# Patient Record
Sex: Male | Born: 1960
Health system: Southern US, Community
[De-identification: ages and names within clinical notes are randomized; demographics above are authoritative.]

## PROBLEM LIST (undated history)

## (undated) DIAGNOSIS — F1910 Other psychoactive substance abuse, uncomplicated: Secondary | ICD-10-CM

## (undated) DIAGNOSIS — E119 Type 2 diabetes mellitus without complications: Secondary | ICD-10-CM

## (undated) DIAGNOSIS — A159 Respiratory tuberculosis unspecified: Secondary | ICD-10-CM

## (undated) HISTORY — DX: Type 2 diabetes mellitus without complications: E11.9

## (undated) HISTORY — DX: Respiratory tuberculosis unspecified: A15.9

## (undated) HISTORY — DX: Other psychoactive substance abuse, uncomplicated: F19.10

---

## 2000-05-23 ENCOUNTER — Emergency Department (HOSPITAL_COMMUNITY): Admission: EM | Admit: 2000-05-23 | Discharge: 2000-05-23 | Payer: Self-pay | Admitting: Emergency Medicine

## 2000-06-08 ENCOUNTER — Ambulatory Visit (HOSPITAL_BASED_OUTPATIENT_CLINIC_OR_DEPARTMENT_OTHER): Admission: RE | Admit: 2000-06-08 | Discharge: 2000-06-08 | Payer: Self-pay | Admitting: General Surgery

## 2015-06-29 DIAGNOSIS — Z139 Encounter for screening, unspecified: Secondary | ICD-10-CM

## 2015-06-29 NOTE — Congregational Nurse Program (Signed)
Congregational Nurse Program Note  Date of Encounter: 06/29/2015  Past Medical History: No past medical history on file.  Encounter Details:     CNP Questionnaire - 06/29/15 1632    Patient Demographics   Is this a new or existing patient? New   Patient is considered a/an Not Applicable   Patient Assistance   Patient's financial/insurance status Uninsured;Low Income   Patient referred to apply for the following financial assistance Not Applicable   Food insecurities addressed Provided food supplies   Transportation assistance No   Assistance securing medications No   Educational health offerings Not Applicable   Encounter Details   Primary purpose of visit Education/Health Concerns   Was an Emergency Department visit averted? Not Applicable   Does patient have a medical provider? Yes   Patient referred to Not Applicable   Was a mental health screening completed? (GAINS tool) No   Does patient have dental issues? No   Since previous encounter, have you referred patient for abnormal blood glucose that resulted in a new diagnosis or medication change? No   For Abstraction Use Only   Does patient have insurance? No    Clinic visit for B/P check.  [place]  Plans to establish with HCP once he has insurance again.  Is currently working.

## 2016-04-13 ENCOUNTER — Encounter: Payer: Self-pay | Admitting: Nurse Practitioner

## 2016-04-13 ENCOUNTER — Other Ambulatory Visit (INDEPENDENT_AMBULATORY_CARE_PROVIDER_SITE_OTHER): Payer: BLUE CROSS/BLUE SHIELD

## 2016-04-13 ENCOUNTER — Ambulatory Visit (INDEPENDENT_AMBULATORY_CARE_PROVIDER_SITE_OTHER): Payer: BLUE CROSS/BLUE SHIELD | Admitting: Nurse Practitioner

## 2016-04-13 DIAGNOSIS — Z23 Encounter for immunization: Secondary | ICD-10-CM

## 2016-04-13 DIAGNOSIS — R739 Hyperglycemia, unspecified: Secondary | ICD-10-CM | POA: Diagnosis not present

## 2016-04-13 DIAGNOSIS — R6889 Other general symptoms and signs: Secondary | ICD-10-CM | POA: Diagnosis not present

## 2016-04-13 DIAGNOSIS — Z8042 Family history of malignant neoplasm of prostate: Secondary | ICD-10-CM | POA: Diagnosis not present

## 2016-04-13 DIAGNOSIS — Z0000 Encounter for general adult medical examination without abnormal findings: Secondary | ICD-10-CM

## 2016-04-13 DIAGNOSIS — E782 Mixed hyperlipidemia: Secondary | ICD-10-CM | POA: Diagnosis not present

## 2016-04-13 DIAGNOSIS — Z0001 Encounter for general adult medical examination with abnormal findings: Secondary | ICD-10-CM

## 2016-04-13 DIAGNOSIS — E119 Type 2 diabetes mellitus without complications: Secondary | ICD-10-CM | POA: Insufficient documentation

## 2016-04-13 LAB — COMPREHENSIVE METABOLIC PANEL
ALBUMIN: 4.3 g/dL (ref 3.5–5.2)
ALT: 21 U/L (ref 0–53)
AST: 15 U/L (ref 0–37)
Alkaline Phosphatase: 74 U/L (ref 39–117)
BUN: 13 mg/dL (ref 6–23)
CALCIUM: 9.8 mg/dL (ref 8.4–10.5)
CHLORIDE: 104 meq/L (ref 96–112)
CO2: 31 mEq/L (ref 19–32)
Creatinine, Ser: 1.01 mg/dL (ref 0.40–1.50)
GFR: 98.76 mL/min (ref >60.00)
Glucose, Bld: 102 mg/dL — ABNORMAL HIGH (ref 70–99)
POTASSIUM: 5.2 meq/L — AB (ref 3.5–5.1)
SODIUM: 139 meq/L (ref 135–145)
Total Bilirubin: 0.4 mg/dL (ref 0.2–1.2)
Total Protein: 7.4 g/dL (ref 6.0–8.3)

## 2016-04-13 LAB — CBC WITH DIFFERENTIAL/PLATELET
BASOS PCT: 0.4 % (ref 0.0–3.0)
Basophils Absolute: 0 10*3/uL (ref 0.0–0.1)
EOS PCT: 1.1 % (ref 0.0–5.0)
Eosinophils Absolute: 0.1 10*3/uL (ref 0.0–0.7)
HCT: 43.6 % (ref 39.0–52.0)
HEMOGLOBIN: 14.7 g/dL (ref 13.0–17.0)
Lymphocytes Relative: 38 % (ref 12.0–46.0)
Lymphs Abs: 2.8 10*3/uL (ref 0.7–4.0)
MCHC: 33.7 g/dL (ref 30.0–36.0)
MCV: 86.6 fl (ref 78.0–100.0)
MONO ABS: 0.7 10*3/uL (ref 0.1–1.0)
Monocytes Relative: 9 % (ref 3.0–12.0)
NEUTROS ABS: 3.7 10*3/uL (ref 1.4–7.7)
Neutrophils Relative %: 51.5 % (ref 43.0–77.0)
PLATELETS: 309 10*3/uL (ref 150.0–400.0)
RBC: 5.04 Mil/uL (ref 4.22–5.81)
RDW: 12.9 % (ref 11.5–15.5)
WBC: 7.3 10*3/uL (ref 4.0–10.5)

## 2016-04-13 LAB — LIPID PANEL
CHOL/HDL RATIO: 4
Cholesterol: 215 mg/dL — ABNORMAL HIGH (ref 0–200)
HDL: 53 mg/dL (ref >39.00)
LDL CALC: 132 mg/dL — AB (ref 0–99)
NONHDL: 162.43
Triglycerides: 151 mg/dL — ABNORMAL HIGH (ref 0.0–149.0)
VLDL: 30.2 mg/dL (ref 0.0–40.0)

## 2016-04-13 LAB — PSA: PSA: 0.61 ng/mL (ref 0.10–4.00)

## 2016-04-13 LAB — HEMOGLOBIN A1C: Hgb A1c MFr Bld: 6.2 % (ref 4.6–6.5)

## 2016-04-13 LAB — TSH: TSH: 2.46 u[IU]/mL (ref 0.35–4.50)

## 2016-04-13 MED ORDER — PRAVASTATIN SODIUM 40 MG PO TABS: 40.0000 mg | ORAL_TABLET | Freq: Every day | ORAL | 3 refills | Status: DC

## 2016-04-13 NOTE — Progress Notes (Addendum)
Subjective:    Patient ID: Stephen Wong, male    DOB: 16-Jul-1961, 55 y.o.   MRN: [id number]  Patient presents today for complete physical or establish care (new patient)  HPI Denies any acute complaints today. I Immunizations: (TDAP, Hep C screen, Pneumovax, Influenza, zoster)  Health Maintenance  Topic Date Due  .  Hepatitis C: One time screening is recommended by Center for Disease Control  (CDC) for  adults born from [birth year]1945 through 1965.   017-Dec-1962  . HIV Screening  08/17/1976  . Flu Shot  03/31/2017*  . Colon Cancer Screening  07/01/2022  . Tetanus Vaccine  04/13/2026  *Topic was postponed. The date shown is not the original due date.   Diet:None Weight: Not concerned at this time Wt Readings from Last 3 Encounters:  06/29/15 168 lb (76.2 kg)    Exercise:Goes to gym 2-3 times a week. Depression/Suicide: Denies any No flowsheet data found. No flowsheet data found. Colonoscopy (every 5-859yrs, >50-6493yrs): 2 years ago, normal per patient, done by [facility]. PSA (yearly, >7535yrs): Never done [facility]: Up-to-date Dental: Patient was scheduled Sexual History (birth control, marital status, STD): Single and not sexually active.  Medications and allergies reviewed with patient and updated if appropriate.  Patient Active Problem List   Diagnosis Date Noted  . FHx: cancer of prostate 04/13/2016  . Hyperlipidemia, mixed 04/13/2016  . Hyperglycemia 04/13/2016    No current outpatient prescriptions on file prior to visit.   No current facility-administered medications on file prior to visit.     Past Medical History:  Diagnosis Date  . Substance abuse    Lead to incarceration, now 20 years sober from cocaine and alcohol.  . 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    History reviewed. No pertinent surgical history.  Social History   Social History  . Marital status: Single    Spouse name: N/A  . Number of children:  N/A  . Years of education: N/A   Social History Main Topics  . Smoking status: Former Games developermoker  . Smokeless tobacco: Never Used  . Alcohol use No     Comment: 20 years sober, does not attend AA meetings at this time.  . Drug use: No     Comment: Has not used any cocaine in the last 20 years.  . Sexual activity: No   Other Topics Concern  . None   Social History Narrative  . None    Family History  Problem Relation Age of Onset  . Cancer Mother   . Cancer Father   . Heart disease Sister         Review of Systems  Constitutional: Negative for fever, malaise/fatigue and weight loss.  HENT: Negative for congestion and sore throat.   Eyes:       Negative for visual changes  Respiratory: Negative for cough and shortness of breath.   Cardiovascular: Negative for chest pain, palpitations and leg swelling.  Gastrointestinal: Negative for blood in stool, constipation, diarrhea and heartburn.  Genitourinary: Negative for dysuria, frequency and urgency.  Musculoskeletal: Negative for falls, joint pain and myalgias.  Skin: Negative for rash.  Neurological: Negative for dizziness, sensory change and headaches.  Endo/Heme/Allergies: Does not bruise/bleed easily.  Psychiatric/Behavioral: Negative for depression, substance abuse and suicidal ideas. The patient is not nervous/anxious.     Objective:  There were no vitals filed for this visit.  There is no height or weight on file to calculate  BMI.   Physical Examination:  Physical Exam  Constitutional: He is oriented to person, place, and time and well-developed, well-nourished, and in no distress. No distress.  HENT:  Right Ear: External ear normal.  Left Ear: External ear normal.  Nose: Nose normal.  Mouth/Throat: Oropharynx is clear and moist. No oropharyngeal exudate.  Eyes: Conjunctivae and EOM are normal. Pupils are equal, round, and reactive to light. No scleral icterus.  Neck: Normal range of motion. Neck supple. No  thyromegaly present.  Cardiovascular: Normal rate, regular rhythm, normal heart sounds and intact distal pulses.   Pulmonary/Chest: Effort normal and breath sounds normal.  Abdominal: Bowel sounds are normal. He exhibits no distension. There is no tenderness.  Genitourinary:  Genitourinary Comments: Patient declined.  Musculoskeletal: Normal range of motion. He exhibits no edema or tenderness.  Lymphadenopathy:    He has no cervical adenopathy.  Neurological: He is alert and oriented to person, place, and time. He has normal reflexes. No cranial nerve deficit. Gait normal.  Skin: Skin is warm and dry.  Psychiatric: Affect and judgment normal.  Vitals reviewed.   ASSESSMENT and PLAN:  Ebb was seen today for establish care.  Diagnoses and all orders for this visit:  Encounter for preventative adult health care exam with abnormal findings -     EKG 12-Lead -     CBC w/Diff; Future -     Comprehensive metabolic panel; Future -     TSH; Future -     Hemoglobin A1c; Future -     Lipid Profile; Future -     PSA; Future -     HIV antibody (with reflex); Future -     Hepatitis C Ab Reflex HCV RNA, QUANT; Future  FHx: cancer of prostate  Need for prophylactic vaccination with combined diphtheria-tetanus-pertussis (DTP) vaccine -     Tdap vaccine greater than or equal to 7yo IM  Hyperlipidemia, mixed -     pravastatin (PRAVACHOL) 40 MG tablet; Take 1 tablet (40 mg total) by mouth daily.  Hyperglycemia  Normal EKG, no ST interval or T-wave abnormality.  Hyperlipidemia, mixed LDL goal less than 70 FH of CAD/MI, sudden death of sister at 68. Start pravastatin 40 mg.  Follow-up in 3 months  Hyperglycemia Repeat A1c in 3 months. Diet and exercise recommended for now.     Follow up: Return in about 3 months (around 07/13/2016) for Hyperlipidermia, hyperglycemia.  Alysia Penna, NP

## 2016-04-13 NOTE — Addendum Note (Signed)
Addended by: Michaela CornerNCHE, Zira Helinski L on: 04/13/2016 04:38 PM   Modules accepted: Orders

## 2016-04-13 NOTE — Progress Notes (Signed)
abnormal, Inform patient CBC, PSA, and TSH normal. Hgb A1C indicates prediabetes: this can improves with low sugar and low carb diet. Increase vegetables and fruit intake. CMP indicates mild increase in potassium and glucose. Will repeat levels in 3months. Lipid panel indicates increase in total cholesterol, triglycerides, and LDL. Due to patient's risk factors (first degree relative with an MI, prediabetes, and age); I recommend starting patient on low-dose statin, regular daily exercise, and heart healthy diet. Prescription for pravastatin was sent. Start today at bedtime. He needs to make an appointment to follow-up in 3 months.

## 2016-04-13 NOTE — Patient Instructions (Signed)
Maintain heart healthy diet and regular daily exercise. Go to lab downstairs for blood draw

## 2016-04-13 NOTE — Assessment & Plan Note (Signed)
LDL goal less than 70 FH of CAD/MI, sudden death of sister at 451. Start pravastatin 40 mg.  Follow-up in 3 months

## 2016-04-13 NOTE — Addendum Note (Signed)
Addended by: Radford PaxAIRRIKIER DAVIDSON, Rena Sweeden M on: 04/13/2016 02:09 PM   Modules accepted: Orders

## 2016-04-13 NOTE — Assessment & Plan Note (Signed)
Repeat A1c in 3 months. Diet and exercise recommended for now.

## 2016-04-14 ENCOUNTER — Telehealth: Payer: Self-pay

## 2016-04-14 LAB — HIV ANTIBODY (ROUTINE TESTING W REFLEX): HIV 1&2 Ab, 4th Generation: NONREACTIVE

## 2016-04-14 LAB — HEPATITIS C ANTIBODY: HCV Ab: NEGATIVE

## 2016-04-14 NOTE — Telephone Encounter (Signed)
Statin med faxed in to [facility] [address]

## 2016-04-14 NOTE — Progress Notes (Signed)
negative results.

## 2016-07-21 ENCOUNTER — Ambulatory Visit: Payer: BLUE CROSS/BLUE SHIELD | Admitting: Nurse Practitioner

## 2016-08-08 ENCOUNTER — Ambulatory Visit: Payer: BLUE CROSS/BLUE SHIELD | Admitting: Nurse Practitioner

## 2016-08-09 ENCOUNTER — Encounter: Payer: Self-pay | Admitting: Nurse Practitioner

## 2016-08-09 ENCOUNTER — Ambulatory Visit: Payer: BLUE CROSS/BLUE SHIELD | Admitting: Nurse Practitioner

## 2016-08-09 ENCOUNTER — Ambulatory Visit (INDEPENDENT_AMBULATORY_CARE_PROVIDER_SITE_OTHER): Payer: BLUE CROSS/BLUE SHIELD | Admitting: Nurse Practitioner

## 2016-08-09 VITALS — BP 130/80 | HR 70 | Temp 98.2°F | Resp 14 | Ht 69.0 in | Wt 199.0 lb

## 2016-08-09 DIAGNOSIS — R739 Hyperglycemia, unspecified: Secondary | ICD-10-CM

## 2016-08-09 DIAGNOSIS — E782 Mixed hyperlipidemia: Secondary | ICD-10-CM

## 2016-08-09 NOTE — Progress Notes (Signed)
Pre visit review using our clinic review tool, if applicable. No additional management support is needed unless otherwise documented below in the visit note. 

## 2016-08-09 NOTE — Progress Notes (Signed)
   Subjective:  Patient ID: Stephen Wong, male    DOB: 05/04/1961  Age: 56 y.o. MRN: [id number]  CC: Follow-up (following up on blood sugar and cholesterol)   Hyperlipidemia  This is a new problem. The current episode started more than 1 month ago. Recent lipid tests were reviewed and are high. He has no history of chronic renal disease, diabetes, hypothyroidism, liver disease, obesity or nephrotic syndrome. There are no known factors aggravating his hyperlipidemia. Pertinent negatives include no chest pain, leg pain, myalgias or shortness of breath. Current antihyperlipidemic treatment includes diet change and exercise (was not able to tolerate pravastatin. caused nausea). Compliance problems include medication side effects.  Risk factors for coronary artery disease include male sex and family history.    Outpatient Medications Prior to Visit  Medication Sig Dispense Refill  . pravastatin (PRAVACHOL) 40 MG tablet Take 1 tablet (40 mg total) by mouth daily. (Patient not taking: Reported on 08/09/2016) 30 tablet 3   No facility-administered medications prior to visit.     ROS See HPI  Objective:  BP 130/80   Pulse 70   Temp 98.2 F (36.8 C) (Oral)   Resp 14   Ht 5\' 9"  (1.753 m)   Wt 199 lb (90.3 kg)   SpO2 98%   BMI 29.39 kg/m   BP Readings from Last 3 Encounters:  08/09/16 130/80  06/29/15 120/78    Wt Readings from Last 3 Encounters:  08/09/16 199 lb (90.3 kg)  06/29/15 168 lb (76.2 kg)    Physical Exam  Constitutional: He is oriented to person, place, and time. No distress.  Neck: Normal range of motion. Neck supple. No thyromegaly present.  Cardiovascular: Normal rate, regular rhythm and normal heart sounds.   Pulmonary/Chest: Effort normal and breath sounds normal.  Abdominal: Soft. Bowel sounds are normal.  Musculoskeletal: He exhibits no edema.  Neurological: He is alert and oriented to person, place, and time.  Skin: Skin is warm and dry.  Vitals  reviewed.   Lab Results  Component Value Date   WBC 7.3 04/13/2016   HGB 14.7 04/13/2016   HCT 43.6 04/13/2016   PLT 309.0 04/13/2016   GLUCOSE 102 (H) 04/13/2016   CHOL 215 (H) 04/13/2016   TRIG 151.0 (H) 04/13/2016   HDL 53.00 04/13/2016   LDLCALC 132 (H) 04/13/2016   ALT 21 04/13/2016   AST 15 04/13/2016   NA 139 04/13/2016   K 5.2 (H) 04/13/2016   CL 104 04/13/2016   CREATININE 1.01 04/13/2016   BUN 13 04/13/2016   CO2 31 04/13/2016   TSH 2.46 04/13/2016   PSA 0.61 04/13/2016   HGBA1C 6.2 04/13/2016    No results found.  Assessment & Plan:   Stephen Wong was seen today for follow-up.  Diagnoses and all orders for this visit:  Hyperlipidemia, mixed -     Lipid panel; Future  Hyperglycemia -     Hemoglobin A1c; Future   I have discontinued [name]'s pravastatin.  No orders of the defined types were placed in this encounter.   Follow-up: Return in about 6 months (around 02/06/2017) for hyperlipidemia and hyperglycemia.  [name], NP

## 2016-08-09 NOTE — Assessment & Plan Note (Signed)
Unable to tolerate pravastatin. Opted to use diet and exercise. Will repeat lipid panel in 3months

## 2016-08-09 NOTE — Patient Instructions (Addendum)
Go to lab fasting (nothing to eat at least 6-8hrs prior) anytime after 11/07/16 and before 02/06/17. You will be called with lab results.  Fat and Cholesterol Restricted Diet High levels of fat and cholesterol in your blood may lead to various health problems, such as diseases of the heart, blood vessels, gallbladder, liver, and pancreas. Fats are concentrated sources of energy that come in various forms. Certain types of fat, including saturated fat, may be harmful in excess. Cholesterol is a substance needed by your body in small amounts. Your body makes all the cholesterol it needs. Excess cholesterol comes from the food you eat. When you have high levels of cholesterol and saturated fat in your blood, health problems can develop because the excess fat and cholesterol will gather along the walls of your blood vessels, causing them to narrow. Choosing the right foods will help you control your intake of fat and cholesterol. This will help keep the levels of these substances in your blood within normal limits and reduce your risk of disease. What is my plan? Your health care provider recommends that you:  Limit your fat intake to ______% or less of your total calories per day.  Limit the amount of cholesterol in your diet to less than _________mg per day.  Eat 20-30 grams of fiber each day. What types of fat should I choose?  Choose healthy fats more often. Choose monounsaturated and polyunsaturated fats, such as olive and canola oil, flaxseeds, walnuts, almonds, and seeds.  Eat more omega-3 fats. Good choices include salmon, mackerel, sardines, tuna, flaxseed oil, and ground flaxseeds. Aim to eat fish at least two times a week.  Limit saturated fats. Saturated fats are primarily found in animal products, such as meats, butter, and cream. Plant sources of saturated fats include palm oil, palm kernel oil, and coconut oil.  Avoid foods with partially hydrogenated oils in them. These contain trans  fats. Examples of foods that contain trans fats are stick margarine, some tub margarines, cookies, crackers, and other baked goods. What general guidelines do I need to follow? These guidelines for healthy eating will help you control your intake of fat and cholesterol:  Check food labels carefully to identify foods with trans fats or high amounts of saturated fat.  Fill one half of your plate with vegetables and green salads.  Fill one fourth of your plate with whole grains. Look for the word "whole" as the first word in the ingredient list.  Fill one fourth of your plate with lean protein foods.  Limit fruit to two servings a day. Choose fruit instead of juice.  Eat more foods that contain fiber, such as apples, broccoli, carrots, beans, peas, and barley.  Eat more home-cooked food and less restaurant, buffet, and fast food.  Limit or avoid alcohol.  Limit foods high in starch and sugar.  Limit fried foods.  Cook foods using methods other than frying. Baking, boiling, grilling, and broiling are all great options.  Lose weight if you are overweight. Losing just 5-10% of your initial body weight can help your overall health and prevent diseases such as diabetes and heart disease. What foods can I eat? Grains  Whole grains, such as whole wheat or whole grain breads, crackers, cereals, and pasta. Unsweetened oatmeal, bulgur, barley, quinoa, or brown rice. Corn or whole wheat flour tortillas. Vegetables  Fresh or frozen vegetables (raw, steamed, roasted, or grilled). Green salads. Fruits  All fresh, canned (in natural juice), or frozen fruits. Meats and other  protein foods  Ground beef (85% or leaner), grass-fed beef, or beef trimmed of fat. Skinless chicken or [place]. Ground chicken or [place]. Pork trimmed of fat. All fish and seafood. Eggs. Dried beans, peas, or lentils. Unsalted nuts or seeds. Unsalted canned or dry beans. Dairy  Low-fat dairy products, such as skim or 1%  milk, 2% or reduced-fat cheeses, low-fat ricotta or cottage cheese, or plain low-fat yo Fats and oils  Tub margarines without trans fats. Light or reduced-fat mayonnaise and salad dressings. Avocado. Olive, canola, sesame, or safflower oils. Natural peanut or almond butter (choose ones without added sugar and oil). The items listed above may not be a complete list of recommended foods or beverages. Contact your dietitian for more options.  Foods to avoid Grains  White bread. White pasta. White rice. Cornbread. Bagels, pastries, and croissants. Crackers that contain trans fat. Vegetables  White potatoes. Corn. Creamed or fried vegetables. Vegetables in a cheese sauce. Fruits  Dried fruits. Canned fruit in light or heavy syrup. Fruit juice. Meats and other protein foods  Fatty cuts of meat. Ribs, chicken wings, bacon, sausage, bologna, salami, chitterlings, fatback, hot dogs, bratwurst, and packaged luncheon meats. Liver and organ meats. Dairy  Whole or 2% milk, cream, half-and-half, and cream cheese. Whole milk cheeses. Whole-fat or sweetened yogurt. Full-fat cheeses. Nondairy creamers and whipped toppings. Processed cheese, cheese spreads, or cheese curds. Beverages  Alcohol. Sweetened drinks (such as sodas, lemonade, and fruit drinks or punches). Fats and oils  Butter, stick margarine, lard, shortening, ghee, or bacon fat. Coconut, palm kernel, or palm oils. Sweets and desserts  Corn syrup, sugars, honey, and molasses. Candy. Jam and jelly. Syrup. Sweetened cereals. Cookies, pies, cakes, donuts, muffins, and ice cream. The items listed above may not be a complete list of foods and beverages to avoid. Contact your dietitian for more information.  This information is not intended to replace advice given to you by your health care provider. Make sure you discuss any questions you have with your health care provider. Document Released: 07/17/2005 Document Revised: 08/07/2014 Document  Reviewed: 10/15/2013 Elsevier Interactive Patient Education  2017 [employer]

## 2017-01-22 ENCOUNTER — Ambulatory Visit: Payer: Self-pay

## 2017-01-22 ENCOUNTER — Other Ambulatory Visit: Payer: Self-pay | Admitting: Occupational Medicine

## 2017-01-22 DIAGNOSIS — Z0000 Encounter for general adult medical examination without abnormal findings: Secondary | ICD-10-CM

## 2017-02-06 ENCOUNTER — Ambulatory Visit: Payer: BLUE CROSS/BLUE SHIELD | Admitting: Nurse Practitioner

## 2017-02-09 ENCOUNTER — Encounter: Payer: Self-pay | Admitting: Nurse Practitioner

## 2017-02-09 ENCOUNTER — Ambulatory Visit (INDEPENDENT_AMBULATORY_CARE_PROVIDER_SITE_OTHER): Payer: BLUE CROSS/BLUE SHIELD | Admitting: Nurse Practitioner

## 2017-02-09 VITALS — BP 114/80 | HR 68 | Temp 98.0°F | Ht 69.0 in | Wt 185.0 lb

## 2017-02-09 DIAGNOSIS — E782 Mixed hyperlipidemia: Secondary | ICD-10-CM

## 2017-02-09 NOTE — Progress Notes (Signed)
   Subjective:  Patient ID: Stephen Wong, male    DOB: 09-18-1960  Age: 56 y.o. MRN: [id number]  CC: Follow-up (6 mo fu/)   HPI  Hyperlipidemia: He is not interested in use of medication at this time. Did not go for lipid panel as instructed. He is not fasting either. Maintaining low fat diet and regular exercise at this time. He denies tobacco use and ETOH use. Has FHx of CAD and CVA.  No outpatient prescriptions prior to visit.   No facility-administered medications prior to visit.     ROS Review of Systems  Gastrointestinal: Negative for abdominal pain.  Musculoskeletal: Negative for myalgias.  Skin: Negative.      Objective:  BP 114/80   Pulse 68   Temp 98 F (36.7 C)   Ht 5\' 9"  (1.753 m)   Wt 185 lb (83.9 kg)   SpO2 98%   BMI 27.32 kg/m   BP Readings from Last 3 Encounters:  02/09/17 114/80  08/09/16 130/80  06/29/15 120/78    Wt Readings from Last 3 Encounters:  02/09/17 185 lb (83.9 kg)  08/09/16 199 lb (90.3 kg)  06/29/15 168 lb (76.2 kg)    Physical Exam  Constitutional: He is oriented to person, place, and time. No distress.  Neck: Normal range of motion. Neck supple. No JVD present. No thyromegaly present.  Cardiovascular: Normal rate, regular rhythm and normal heart sounds.   Pulmonary/Chest: Effort normal and breath sounds normal.  Abdominal: Soft. Bowel sounds are normal. He exhibits no distension. There is no tenderness.  Musculoskeletal: He exhibits no edema.  Neurological: He is alert and oriented to person, place, and time.  Skin: Skin is warm and dry.  Psychiatric: He has a normal mood and affect. His behavior is normal.  Vitals reviewed.   Lab Results  Component Value Date   WBC 7.3 04/13/2016   HGB 14.7 04/13/2016   HCT 43.6 04/13/2016   PLT 309.0 04/13/2016   GLUCOSE 102 (H) 04/13/2016   CHOL 215 (H) 04/13/2016   TRIG 151.0 (H) 04/13/2016   HDL 53.00 04/13/2016   LDLCALC 132 (H) 04/13/2016   ALT 21 04/13/2016   AST 15  04/13/2016   NA 139 04/13/2016   K 5.2 (H) 04/13/2016   CL 104 04/13/2016   CREATININE 1.01 04/13/2016   BUN 13 04/13/2016   CO2 31 04/13/2016   TSH 2.46 04/13/2016   PSA 0.61 04/13/2016   HGBA1C 6.2 04/13/2016    Assessment & Plan:   Stephen Wong was seen today for follow-up.  Diagnoses and all orders for this visit:  Hyperlipidemia, mixed   Stephen Wong does not currently have medications on file.  No orders of the defined types were placed in this encounter.   Follow-up: Return in about 6 months (around 08/12/2017) for hyperlipidemia.  Alysia Pennaharlotte Maddeline Roorda, NP

## 2017-02-14 ENCOUNTER — Encounter: Payer: Self-pay | Admitting: Nurse Practitioner

## 2017-02-14 NOTE — Patient Instructions (Addendum)
Please go to lab for blood draw tomorrow (nned to be fasting at least 6-8hrs prior to blood draw).  I advised patient about cardiovascular risk with abnormal lipid panel and family history.  Fat and Cholesterol Restricted Diet Getting too much fat and cholesterol in your diet may cause health problems. Following this diet helps keep your fat and cholesterol at normal levels. This can keep you from getting sick. What types of fat should I choose?  Choose monosaturated and polyunsaturated fats. These are found in foods such as olive oil, canola oil, flaxseeds, walnuts, almonds, and seeds.  Eat more omega-3 fats. Good choices include salmon, mackerel, sardines, tuna, flaxseed oil, and ground flaxseeds.  Limit saturated fats. These are in animal products such as meats, butter, and cream. They can also be in plant products such as palm oil, palm kernel oil, and coconut oil.  Avoid foods with partially hydrogenated oils in them. These contain trans fats. Examples of foods that have trans fats are stick margarine, some tub margarines, cookies, crackers, and other baked goods. What general guidelines do I need to follow?  Check food labels. Look for the words "trans fat" and "saturated fat."  When preparing a meal: ? Fill half of your plate with vegetables and green salads. ? Fill one fourth of your plate with whole grains. Look for the word "whole" as the first word in the ingredient list. ? Fill one fourth of your plate with lean protein foods.  Eat more foods that have fiber, like apples, carrots, beans, peas, and barley.  Eat more home-cooked foods. Eat less at restaurants and buffets.  Limit or avoid alcohol.  Limit foods high in starch and sugar.  Limit fried foods.  Cook foods without frying them. Baking, boiling, grilling, and broiling are all great options.  Lose weight if you are overweight. Losing even a small amount of weight can help your overall health. It can also help  prevent diseases such as diabetes and heart disease. What foods can I eat? Grains Whole grains, such as whole wheat or whole grain breads, crackers, cereals, and pasta. Unsweetened oatmeal, bulgur, barley, quinoa, or brown rice. Corn or whole wheat flour tortillas. Vegetables Fresh or frozen vegetables (raw, steamed, roasted, or grilled). Green salads. Fruits All fresh, canned (in natural juice), or frozen fruits. Meat and Other Protein Products Ground beef (85% or leaner), grass-fed beef, or beef trimmed of fat. Skinless chicken or [place]. Ground chicken or [place]. Pork trimmed of fat. All fish and seafood. Eggs. Dried beans, peas, or lentils. Unsalted nuts or seeds. Unsalted canned or dry beans. Dairy Low-fat dairy products, such as skim or 1% milk, 2% or reduced-fat cheeses, low-fat ricotta or cottage cheese, or plain low-fat yogurt. Fats and Oils Tub margarines without trans fats. Light or reduced-fat mayonnaise and salad dressings. Avocado. Olive, canola, sesame, or safflower oils. Natural peanut or almond butter (choose ones without added sugar and oil). The items listed above may not be a complete list of recommended foods or beverages. Contact your dietitian for more options. What foods are not recommended? Grains White bread. White pasta. White rice. Cornbread. Bagels, pastries, and croissants. Crackers that contain trans fat. Vegetables White potatoes. Corn. Creamed or fried vegetables. Vegetables in a cheese sauce. Fruits Dried fruits. Canned fruit in light or heavy syrup. Fruit juice. Meat and Other Protein Products Fatty cuts of meat. Ribs, chicken wings, bacon, sausage, bologna, salami, chitterlings, fatback, hot dogs, bratwurst, and packaged luncheon meats. Liver and organ meats. Dairy  Whole or 2% milk, cream, half-and-half, and cream cheese. Whole milk cheeses. Whole-fat or sweetened yogurt. Full-fat cheeses. Nondairy creamers and whipped toppings. Processed cheese, cheese  spreads, or cheese curds. Sweets and Desserts Corn syrup, sugars, honey, and molasses. Candy. Jam and jelly. Syrup. Sweetened cereals. Cookies, pies, cakes, donuts, muffins, and ice cream. Fats and Oils Butter, stick margarine, lard, shortening, ghee, or bacon fat. Coconut, palm kernel, or palm oils. Beverages Alcohol. Sweetened drinks (such as sodas, lemonade, and fruit drinks or punches). The items listed above may not be a complete list of foods and beverages to avoid. Contact your dietitian for more information. This information is not intended to replace advice given to you by your health care provider. Make sure you discuss any questions you have with your health care provider. Document Released: 01/16/2012 Document Revised: 03/23/2016 Document Reviewed: 10/16/2013 Elsevier Interactive Patient Education  [employer]2018 [employer] Inc.

## 2017-08-31 ENCOUNTER — Encounter: Payer: Self-pay | Admitting: Nurse Practitioner

## 2017-08-31 ENCOUNTER — Ambulatory Visit: Payer: Self-pay | Admitting: Nurse Practitioner

## 2017-08-31 VITALS — BP 112/80 | HR 66 | Temp 97.7°F | Ht 69.0 in | Wt 191.0 lb

## 2017-08-31 DIAGNOSIS — E119 Type 2 diabetes mellitus without complications: Secondary | ICD-10-CM

## 2017-08-31 DIAGNOSIS — N529 Male erectile dysfunction, unspecified: Secondary | ICD-10-CM | POA: Diagnosis not present

## 2017-08-31 DIAGNOSIS — R739 Hyperglycemia, unspecified: Secondary | ICD-10-CM

## 2017-08-31 DIAGNOSIS — E782 Mixed hyperlipidemia: Secondary | ICD-10-CM

## 2017-08-31 LAB — BASIC METABOLIC PANEL
BUN: 17 mg/dL (ref 6–23)
CHLORIDE: 104 meq/L (ref 96–112)
CO2: 28 meq/L (ref 19–32)
CREATININE: 1.03 mg/dL (ref 0.40–1.50)
Calcium: 9.5 mg/dL (ref 8.4–10.5)
GFR: 96.06 mL/min (ref >60.00)
GLUCOSE: 74 mg/dL (ref 70–99)
POTASSIUM: 4.9 meq/L (ref 3.5–5.1)
Sodium: 140 mEq/L (ref 135–145)

## 2017-08-31 LAB — LIPID PANEL
CHOLESTEROL: 142 mg/dL (ref 0–200)
HDL: 56 mg/dL (ref >39.00)
NonHDL: 86.12
TRIGLYCERIDES: 236 mg/dL — AB (ref 0.0–149.0)
Total CHOL/HDL Ratio: 3
VLDL: 47.2 mg/dL — ABNORMAL HIGH (ref 0.0–40.0)

## 2017-08-31 LAB — HEMOGLOBIN A1C: Hgb A1c MFr Bld: 6.6 % — ABNORMAL HIGH (ref 4.6–6.5)

## 2017-08-31 LAB — LDL CHOLESTEROL, DIRECT: LDL DIRECT: 47 mg/dL

## 2017-08-31 MED ORDER — SILDENAFIL CITRATE 50 MG PO TABS: 50.0000 mg | ORAL_TABLET | Freq: Every day | ORAL | 0 refills | Status: DC | PRN

## 2017-08-31 NOTE — Patient Instructions (Addendum)
Worsening triglyceride and hgbA1c. You are now considered diabetic with A1c of 6.6 Normal BMP. It is imperative for you to make changes to your diet as we discussed. You are to increase as weel. F/up in 3months (fasting) as discussed. Needs repeat colonoscopy???  ED: viagra prescribed, consider testosterone testing and urology referral if no improvement with viagra??  DASH Eating Plan DASH stands for "Dietary Approaches to Stop Hypertension." The DASH eating plan is a healthy eating plan that has been shown to reduce high blood pressure (hypertension). It may also reduce your risk for type 2 diabetes, heart disease, and stroke. The DASH eating plan may also help with weight loss. What are tips for following this plan? General guidelines  Avoid eating more than 2,300 mg (milligrams) of salt (sodium) a day. If you have hypertension, you may need to reduce your sodium intake to 1,500 mg a day.  Limit alcohol intake to no more than 1 drink a day for nonpregnant women and 2 drinks a day for men. One drink equals 12 oz of beer, 5 oz of wine, or 1 oz of hard liquor.  Work with your health care provider to maintain a healthy body weight or to lose weight. Ask what an ideal weight is for you.  Get at least 30 minutes of exercise that causes your heart to beat faster (aerobic exercise) most days of the week. Activities may include walking, swimming, or biking.  Work with your health care provider or diet and nutrition specialist (dietitian) to adjust your eating plan to your individual calorie needs. Reading food labels  Check food labels for the amount of sodium per serving. Choose foods with less than 5 percent of the Daily Value of sodium. Generally, foods with less than 300 mg of sodium per serving fit into this eating plan.  To find whole grains, look for the word "whole" as the first word in the ingredient list. Shopping  Buy products labeled as "low-sodium" or "no salt added."  Buy  fresh foods. Avoid canned foods and premade or frozen meals. Cooking  Avoid adding salt when cooking. Use salt-free seasonings or herbs instead of table salt or sea salt. Check with your health care provider or pharmacist before using salt substitutes.  Do not fry foods. Cook foods using healthy methods such as baking, boiling, grilling, and broiling instead.  Cook with heart-healthy oils, such as olive, canola, soybean, or sunflower oil. Meal planning   Eat a balanced diet that includes: ? 5 or more servings of fruits and vegetables each day. At each meal, try to fill half of your plate with fruits and vegetables. ? Up to 6-8 servings of whole grains each day. ? Less than 6 oz of lean meat, poultry, or fish each day. A 3-oz serving of meat is about the same size as a deck of cards. One egg equals 1 oz. ? 2 servings of low-fat dairy each day. ? A serving of nuts, seeds, or beans 5 times each week. ? Heart-healthy fats. Healthy fats called Omega-3 fatty acids are found in foods such as flaxseeds and coldwater fish, like sardines, salmon, and mackerel.  Limit how much you eat of the following: ? Canned or prepackaged foods. ? Food that is high in trans fat, such as fried foods. ? Food that is high in saturated fat, such as fatty meat. ? Sweets, desserts, sugary drinks, and other foods with added sugar. ? Full-fat dairy products.  Do not salt foods before eating.  Try  to eat at least 2 vegetarian meals each week.  Eat more home-cooked food and less restaurant, buffet, and fast food.  When eating at a restaurant, ask that your food be prepared with less salt or no salt, if possible. What foods are recommended? The items listed may not be a complete list. Talk with your dietitian about what dietary choices are best for you. Grains Whole-grain or whole-wheat bread. Whole-grain or whole-wheat pasta. Brown rice. Orpah Cobbatmeal. Quinoa. Bulgur. Whole-grain and low-sodium cereals. Pita bread.  Low-fat, low-sodium crackers. Whole-wheat flour tortillas. Vegetables Fresh or frozen vegetables (raw, steamed, roasted, or grilled). Low-sodium or reduced-sodium tomato and vegetable juice. Low-sodium or reduced-sodium tomato sauce and tomato paste. Low-sodium or reduced-sodium canned vegetables. Fruits All fresh, dried, or frozen fruit. Canned fruit in natural juice (without added sugar). Meat and other protein foods Skinless chicken or [place]. Ground chicken or [place]. Pork with fat trimmed off. Fish and seafood. Egg whites. Dried beans, peas, or lentils. Unsalted nuts, nut butters, and seeds. Unsalted canned beans. Lean cuts of beef with fat trimmed off. Low-sodium, lean deli meat. Dairy Low-fat (1%) or fat-free (skim) milk. Fat-free, low-fat, or reduced-fat cheeses. Nonfat, low-sodium ricotta or cottage cheese. Low-fat or nonfat yogurt. Low-fat, low-sodium cheese. Fats and oils Soft margarine without trans fats. Vegetable oil. Low-fat, reduced-fat, or light mayonnaise and salad dressings (reduced-sodium). Canola, safflower, olive, soybean, and sunflower oils. Avocado. Seasoning and other foods Herbs. Spices. Seasoning mixes without salt. Unsalted popcorn and pretzels. Fat-free sweets. What foods are not recommended? The items listed may not be a complete list. Talk with your dietitian about what dietary choices are best for you. Grains Baked goods made with fat, such as croissants, muffins, or some breads. Dry pasta or rice meal packs. Vegetables Creamed or fried vegetables. Vegetables in a cheese sauce. Regular canned vegetables (not low-sodium or reduced-sodium). Regular canned tomato sauce and paste (not low-sodium or reduced-sodium). Regular tomato and vegetable juice (not low-sodium or reduced-sodium). Rosita FirePickles. Olives. Fruits Canned fruit in a light or heavy syrup. Fried fruit. Fruit in cream or butter sauce. Meat and other protein foods Fatty cuts of meat. Ribs. Fried meat. Tomasa BlaseBacon.  Sausage. Bologna and other processed lunch meats. Salami. Fatback. Hotdogs. Bratwurst. Salted nuts and seeds. Canned beans with added salt. Canned or smoked fish. Whole eggs or egg yolks. Chicken or [place] with skin. Dairy Whole or 2% milk, cream, and half-and-half. Whole or full-fat cream cheese. Whole-fat or sweetened yogurt. Full-fat cheese. Nondairy creamers. Whipped toppings. Processed cheese and cheese spreads. Fats and oils Butter. Stick margarine. Lard. Shortening. Ghee. Bacon fat. Tropical oils, such as coconut, palm kernel, or palm oil. Seasoning and other foods Salted popcorn and pretzels. Onion salt, garlic salt, seasoned salt, table salt, and sea salt. Worcestershire sauce. Tartar sauce. Barbecue sauce. Teriyaki sauce. Soy sauce, including reduced-sodium. Steak sauce. Canned and packaged gravies. Fish sauce. Oyster sauce. Cocktail sauce. Horseradish that you find on the shelf. Ketchup. Mustard. Meat flavorings and tenderizers. Bouillon cubes. Hot sauce and Tabasco sauce. Premade or packaged marinades. Premade or packaged taco seasonings. Relishes. Regular salad dressings. Where to find more information:  National Heart, Lung, and Blood Institute: [employer].iswww.nhlbi.nih.gov  American Heart Association: www.heart.org Summary  The DASH eating plan is a healthy eating plan that has been shown to reduce high blood pressure (hypertension). It may also reduce your risk for type 2 diabetes, heart disease, and stroke.  With the DASH eating plan, you should limit salt (sodium) intake to 2,300 mg a day. If you  have hypertension, you may need to reduce your sodium intake to 1,500 mg a day.  When on the DASH eating plan, aim to eat more fresh fruits and vegetables, whole grains, lean proteins, low-fat dairy, and heart-healthy fats.  Work with your health care provider or diet and nutrition specialist (dietitian) to adjust your eating plan to your individual calorie needs. This information is not intended  to replace advice given to you by your health care provider. Make sure you discuss any questions you have with your health care provider. Document Released: 07/06/2011 Document Revised: 07/10/2016 Document Reviewed: 07/10/2016 Elsevier Interactive Patient Education  [employer].

## 2017-08-31 NOTE — Progress Notes (Addendum)
Subjective:  Patient ID: Stephen Wong, male    DOB: 12/19/1960  Age: 57 y.o. MRN: [id number]  CC: Follow-up (follow up--lipid and a1c--not fasting)  HPI  [name] is here to follow up on lipid panel and HgbA1c. He is not interested in use of medication at this time. Due to lack of insurance, repeat labs was not completed. He has not made changes to diet. He stay active at his current job (walking constantly). He also complains of difficulty of getting and maintaining at erection for over 2years. Denies any ETOH or illicit drug use.  Outpatient Medications Prior to Visit  Medication Sig Dispense Refill  . IBU 800 MG tablet take 1 tablet by mouth three times a day with food if needed for pain  0  . methocarbamol (ROBAXIN) 500 MG tablet take 1 tablet by mouth twice a day if needed for MUSCLE PAIN  0   No facility-administered medications prior to visit.     ROS Review of Systems  Constitutional: Negative for diaphoresis, fever and malaise/fatigue.  Respiratory: Negative for cough and shortness of breath.   Cardiovascular: Negative for chest pain and orthopnea.  Gastrointestinal: Negative for blood in stool, constipation, diarrhea and heartburn.  Genitourinary: Negative for dysuria, frequency and urgency.  Skin: Negative.   Neurological: Negative for dizziness, tingling, sensory change, weakness and headaches.  Psychiatric/Behavioral: Negative for depression. The patient is not nervous/anxious.     Objective:  BP 112/80   Pulse 66   Temp 97.7 F (36.5 C)   Ht 5\' 9"  (1.753 m)   Wt 191 lb (86.6 kg)   SpO2 99%   BMI 28.21 kg/m   BP Readings from Last 3 Encounters:  08/31/17 112/80  02/09/17 114/80  08/09/16 130/80    Wt Readings from Last 3 Encounters:  08/31/17 191 lb (86.6 kg)  02/09/17 185 lb (83.9 kg)  08/09/16 199 lb (90.3 kg)    Physical Exam  Constitutional: He is oriented to person, place, and time. No distress.  Neck: Normal range of motion. Neck  supple. No thyromegaly present.  Cardiovascular: Normal rate, regular rhythm and normal heart sounds.  Pulmonary/Chest: Effort normal.  Musculoskeletal: He exhibits no edema.  Neurological: He is alert and oriented to person, place, and time.  Skin: Skin is warm and dry.  Vitals reviewed.   Lab Results  Component Value Date   WBC 7.3 04/13/2016   HGB 14.7 04/13/2016   HCT 43.6 04/13/2016   PLT 309.0 04/13/2016   GLUCOSE 74 08/31/2017   CHOL 142 08/31/2017   TRIG 236.0 (H) 08/31/2017   HDL 56.00 08/31/2017   LDLDIRECT 47.0 08/31/2017   LDLCALC 132 (H) 04/13/2016   ALT 21 04/13/2016   AST 15 04/13/2016   NA 140 08/31/2017   K 4.9 08/31/2017   CL 104 08/31/2017   CREATININE 1.03 08/31/2017   BUN 17 08/31/2017   CO2 28 08/31/2017   TSH 2.46 04/13/2016   PSA 0.61 04/13/2016   HGBA1C 6.6 (H) 08/31/2017    Assessment & Plan:   Stephen JohnBrian was seen today for follow-up.  Diagnoses and all orders for this visit:  Type 2 diabetes mellitus without complication, without long-term current use of insulin (HCC) -     Hemoglobin A1c -     Basic metabolic panel  Hyperlipidemia, mixed -     Lipid panel  Erectile dysfunction, unspecified erectile dysfunction type -     sildenafil (VIAGRA) 50 MG tablet; Take 1 tablet (50 mg total)  by mouth daily as needed for erectile dysfunction.  Other orders -     LDL cholesterol, direct   I am having Stephen Wong start on sildenafil. I am also having him maintain his IBU and methocarbamol.  Meds ordered this encounter  Medications  . sildenafil (VIAGRA) 50 MG tablet    Sig: Take 1 tablet (50 mg total) by mouth daily as needed for erectile dysfunction.    Dispense:  10 tablet    Refill:  0    Order Specific Question:   Supervising Provider    Answer:   Dianne Dun [[id number]]    Follow-up: Return in about 3 months (around 11/28/2017) for DM and Hyperlipidemia (fasting).  Alysia Penna, NP

## 2017-09-06 ENCOUNTER — Telehealth: Payer: Self-pay | Admitting: Nurse Practitioner

## 2017-09-06 NOTE — Telephone Encounter (Signed)
Can take 2tabs if 1tab is not effective.

## 2017-09-06 NOTE — Telephone Encounter (Signed)
Copied from CRM [id number]. Topic: Quick Communication - See Telephone Encounter >> Sep 06, 2017  8:57 AM [name], [name] wrote: CRM for notification. See Telephone encounter for:  Pt states his rx for sildenafil (VIAGRA) 50 MG tablet is not strong enough  and wants higher dosage sent to  09/06/17.[facility] -[address] [place], [place] - [id number] [facility] [address] [phone] (Phone) [phone] (Fax)

## 2017-09-07 ENCOUNTER — Telehealth: Payer: Self-pay | Admitting: General Practice

## 2017-09-07 NOTE — Telephone Encounter (Signed)
Pt is scheduled to see Dr Jonny RuizJohn.

## 2017-09-07 NOTE — Telephone Encounter (Signed)
Pt is aware.  

## 2017-09-07 NOTE — Telephone Encounter (Signed)
Copied from CRM [phone]. Topic: Quick Communication - See Telephone Encounter >> Sep 07, 2017  2:15 PM Rudi CocoLathan, Nishaan Stanke M, NT wrote: CRM for notification. See Telephone encounter for:   09/07/17. Pt. Calling to request that his pcp is a male provider doesn't want to go anywhere else other than [facility]. Please give pt. If there will be a provider available. Pt. Can be reached at [phone] (former pt. Of [facility])

## 2017-09-13 ENCOUNTER — Ambulatory Visit: Payer: Self-pay | Admitting: Internal Medicine

## 2017-09-19 ENCOUNTER — Ambulatory Visit: Payer: Self-pay | Admitting: Internal Medicine

## 2017-09-19 DIAGNOSIS — Z0289 Encounter for other administrative examinations: Secondary | ICD-10-CM

## 2017-10-12 ENCOUNTER — Encounter: Payer: Self-pay | Admitting: Nurse Practitioner

## 2017-10-12 ENCOUNTER — Ambulatory Visit: Payer: Managed Care, Other (non HMO) | Admitting: Nurse Practitioner

## 2017-10-12 VITALS — BP 118/82 | HR 73 | Temp 98.1°F | Wt 189.2 lb

## 2017-10-12 DIAGNOSIS — M25511 Pain in right shoulder: Secondary | ICD-10-CM | POA: Diagnosis not present

## 2017-10-12 MED ORDER — DICLOFENAC SODIUM 2 % TD SOLN: 1.0000 "application " | Freq: Two times a day (BID) | TRANSDERMAL | 0 refills | Status: DC

## 2017-10-12 MED ORDER — MENTHOL-CAMPHOR 10-11 % EX CREA: 1.0000 "application " | TOPICAL_CREAM | Freq: Three times a day (TID) | CUTANEOUS | Status: DC | PRN

## 2017-10-12 NOTE — Progress Notes (Signed)
Subjective:  Patient ID: Stephen Wong, male    DOB: 1961-07-20  Age: 57 y.o. MRN: [id number]  CC: Shoulder Pain (Right shoulder hurts last 3 months, Taking Ibuprofen, continuous movement at work hurts it.)   Shoulder Pain   The pain is present in the right shoulder. This is a chronic problem. The current episode started more than 1 month ago. The problem occurs constantly. The problem has been gradually worsening. The quality of the pain is described as aching. Associated symptoms include joint swelling. Pertinent negatives include no fever, inability to bear weight, itching, joint locking, limited range of motion, numbness, stiffness or tingling. The symptoms are aggravated by activity. He has tried OTC ointments for the symptoms. The treatment provided mild relief. His past medical history is significant for osteoarthritis.  current job involves repetitive motion Physiological scientist)  Outpatient Medications Prior to Visit  Medication Sig Dispense Refill  . IBU 800 MG tablet take 1 tablet by mouth three times a day with food if needed for pain  0  . methocarbamol (ROBAXIN) 500 MG tablet take 1 tablet by mouth twice a day if needed for MUSCLE PAIN  0  . sildenafil (VIAGRA) 50 MG tablet Take 1 tablet (50 mg total) by mouth daily as needed for erectile dysfunction. 10 tablet 0   No facility-administered medications prior to visit.     ROS See HPI  Objective:  BP 118/82   Pulse 73   Temp 98.1 F (36.7 C)   Wt 189 lb 3.2 oz (85.8 kg)   SpO2 93%   BMI 27.94 kg/m   BP Readings from Last 3 Encounters:  10/12/17 118/82  08/31/17 112/80  02/09/17 114/80    Wt Readings from Last 3 Encounters:  10/12/17 189 lb 3.2 oz (85.8 kg)  08/31/17 191 lb (86.6 kg)  02/09/17 185 lb (83.9 kg)    Physical Exam  Constitutional: He is oriented to person, place, and time.  Cardiovascular: Normal rate.  Pulmonary/Chest: Effort normal.  Musculoskeletal: He exhibits tenderness. He exhibits no deformity.         Right shoulder: He exhibits tenderness, swelling and pain. He exhibits normal range of motion, no bony tenderness, no effusion, no crepitus, no spasm, normal pulse and normal strength.       Cervical back: Normal.       Right upper arm: Normal.       Right forearm: Normal.       Right hand: Normal.  Lymphadenopathy:    He has no cervical adenopathy.  Neurological: He is alert and oriented to person, place, and time.  Vitals reviewed.   Lab Results  Component Value Date   WBC 7.3 04/13/2016   HGB 14.7 04/13/2016   HCT 43.6 04/13/2016   PLT 309.0 04/13/2016   GLUCOSE 74 08/31/2017   CHOL 142 08/31/2017   TRIG 236.0 (H) 08/31/2017   HDL 56.00 08/31/2017   LDLDIRECT 47.0 08/31/2017   LDLCALC 132 (H) 04/13/2016   ALT 21 04/13/2016   AST 15 04/13/2016   NA 140 08/31/2017   K 4.9 08/31/2017   CL 104 08/31/2017   CREATININE 1.03 08/31/2017   BUN 17 08/31/2017   CO2 28 08/31/2017   TSH 2.46 04/13/2016   PSA 0.61 04/13/2016   HGBA1C 6.6 (H) 08/31/2017     Assessment & Plan:   Stephen Wong was seen today for shoulder pain.  Diagnoses and all orders for this visit:  Right anterior shoulder pain -     Diclofenac  Sodium (PENNSAID) 2 % SOLN; Place 1 application onto the skin 2 times daily at 12 noon and 4 pm. -     Menthol-Camphor 10-11 % CREA; Apply 1 application topically 3 (three) times daily as needed.   I am having Stephen Wong start on Diclofenac Sodium and Menthol-Camphor. I am also having him maintain his IBU, methocarbamol, and sildenafil.  Meds ordered this encounter  Medications  . Diclofenac Sodium (PENNSAID) 2 % SOLN    Sig: Place 1 application onto the skin 2 times daily at 12 noon and 4 pm.    Dispense:  2 g    Refill:  0    Order Specific Question:   Supervising Provider    Answer:   Dianne DunARON, TALIA M [[id number]]  . Menthol-Camphor 10-11 % CREA    Sig: Apply 1 application topically 3 (three) times daily as needed.    Order Specific Question:   Supervising  Provider    Answer:   Dianne DunARON, TALIA M [[id number]]    Follow-up: Return if symptoms worsen or fail to improve.  Alysia Pennaharlotte Milam Allbaugh, NP

## 2017-10-12 NOTE — Patient Instructions (Signed)
Alternate between warm and cold compress as needed. Do not use pennsaid and tiger balm at same time. Separate application by 4hrs apart.  Have FMLA paper faxed to us. You paperwork will be completed to be out of work for 1week.  If no improvement in 2weeks, will refer to sports medicine.  Shoulder Range of Motion Exercises Shoulder range of motion (ROM) exercises are designed to keep the shoulder moving freely. They are often recommended for people who have shoulder pain. Phase 1 exercises When you are able, do this exercise 5-6 days per week, or as told by your health care provider. Work toward doing 2 sets of 10 swings. Pendulum Exercise How To Do This Exercise Lying Down 1. Lie face-down on a bed with your abdomen close to the side of the bed. 2. Let your arm hang over the side of the bed. 3. Relax your shoulder, arm, and hand. 4. Slowly and gently swing your arm forward and back. Do not use your neck muscles to swing your arm. They should be relaxed. If you are struggling to swing your arm, have someone gently swing it for you. When you do this exercise for the first time, swing your arm at a 15 degree angle for 15 seconds, or swing your arm 10 times. As pain lessens over time, increase the angle of the swing to 30-45 degrees. 5. Repeat steps 1-4 with the other arm.  How To Do This Exercise While Standing 1. Stand next to a sturdy chair or table and hold on to it with your hand. 1. Bend forward at the waist. 2. Bend your knees slightly. 3. Relax your other arm and let it hang limp. 4. Relax the shoulder blade of the arm that is hanging and let it drop. 5. While keeping your shoulder relaxed, use body motion to swing your arm in small circles. The first time you do this exercise, swing your arm for about 30 seconds or 10 times. When you do it next time, swing your arm for a little longer. 6. Stand up tall and relax. 7. Repeat steps 1-7, this time changing the direction of the  circles. 2. Repeat steps 1-8 with the other arm.  Phase 2 exercises Do these exercises 3-4 times per day on 5-6 days per week or as told by your health care provider. Work toward holding the stretch for 20 seconds. Stretching Exercise 1 1. Lift your arm straight out in front of you. 2. Bend your arm 90 degrees at the elbow (right angle) so your forearm goes across your body and looks like the letter "L." 3. Use your other arm to gently pull the elbow forward and across your body. 4. Repeat steps 1-3 with the other arm. Stretching Exercise 2 You will need a towel or rope for this exercise. 1. Bend one arm behind your back with the palm facing outward. 2. Hold a towel with your other hand. 3. Reach the arm that holds the towel above your head, and bend that arm at the elbow. Your wrist should be behind your neck. 4. Use your free hand to grab the free end of the towel. 5. With the higher hand, gently pull the towel up behind you. 6. With the lower hand, pull the towel down behind you. 7. Repeat steps 1-6 with the other arm.  Phase 3 exercises Do each of these exercises at four different times of day (sessions) every day or as told by your health care provider. To begin with,  repeat each exercise 5 times (repetitions). Work toward doing 3 sets of 12 repetitions or as told by your health care provider. Strengthening Exercise 1 You will need a light weight for this activity. As you grow stronger, you may use a heavier weight. 1. Standing with a weight in your hand, lift your arm straight out to the side until it is at the same height as your shoulder. 2. Bend your arm at 90 degrees so that your fingers are pointing to the ceiling. 3. Slowly raise your hand until your arm is straight up in the air. 4. Repeat steps 1-3 with the other arm.  Strengthening Exercise 2 You will need a light weight for this activity. As you grow stronger, you may use a heavier weight. 1. Standing with a weight in  your hand, gradually move your straight arm in an arc, starting at your side, then out in front of you, then straight up over your head. 2. Gradually move your other arm in an arc, starting at your side, then out in front of you, then straight up over your head. 3. Repeat steps 1-2 with the other arm.  Strengthening Exercise 3 You will need an elastic band for this activity. As you grow stronger, gradually increase the size of the bands or increase the number of bands that you use at one time. 1. While standing, hold an elastic band in one hand and raise that arm up in the air. 2. With your other hand, pull down the band until that hand is by your side. 3. Repeat steps 1-2 with the other arm.  This information is not intended to replace advice given to you by your health care provider. Make sure you discuss any questions you have with your health care provider. Document Released: 04/15/2003 Document Revised: 03/12/2016 Document Reviewed: 07/13/2014 Elsevier Interactive Patient Education  [employer].

## 2017-10-22 ENCOUNTER — Encounter: Payer: Self-pay | Admitting: Nurse Practitioner

## 2017-11-28 ENCOUNTER — Ambulatory Visit: Payer: Managed Care, Other (non HMO) | Admitting: Nurse Practitioner

## 2017-11-30 ENCOUNTER — Ambulatory Visit: Payer: Managed Care, Other (non HMO) | Admitting: Nurse Practitioner

## 2018-01-18 ENCOUNTER — Ambulatory Visit: Payer: Managed Care, Other (non HMO) | Admitting: Family Medicine

## 2018-01-25 ENCOUNTER — Ambulatory Visit: Payer: Managed Care, Other (non HMO) | Admitting: Family Medicine

## 2018-01-29 ENCOUNTER — Ambulatory Visit: Payer: Managed Care, Other (non HMO) | Admitting: Nurse Practitioner

## 2018-11-28 ENCOUNTER — Telehealth: Payer: Self-pay | Admitting: Nurse Practitioner

## 2018-11-28 NOTE — Telephone Encounter (Signed)
Called and could not leave vm. Calling patient to schedule virtual visit with [facility]

## 2019-01-28 ENCOUNTER — Encounter: Payer: Managed Care, Other (non HMO) | Admitting: Nurse Practitioner

## 2019-01-30 ENCOUNTER — Encounter: Payer: Self-pay | Admitting: Nurse Practitioner

## 2019-03-04 ENCOUNTER — Ambulatory Visit: Payer: Self-pay | Admitting: Nurse Practitioner

## 2019-03-06 ENCOUNTER — Encounter: Payer: Self-pay | Admitting: Nurse Practitioner

## 2019-03-07 ENCOUNTER — Encounter: Payer: Self-pay | Admitting: Nurse Practitioner

## 2019-03-26 ENCOUNTER — Encounter: Payer: Self-pay | Admitting: Nurse Practitioner

## 2019-04-10 ENCOUNTER — Telehealth: Payer: Self-pay | Admitting: Nurse Practitioner

## 2019-04-10 NOTE — Telephone Encounter (Signed)

## 2019-04-11 ENCOUNTER — Encounter: Payer: Self-pay | Admitting: Nurse Practitioner

## 2019-09-17 ENCOUNTER — Encounter: Payer: Self-pay | Admitting: Family Medicine

## 2019-09-17 ENCOUNTER — Ambulatory Visit (INDEPENDENT_AMBULATORY_CARE_PROVIDER_SITE_OTHER): Payer: Self-pay | Admitting: Family Medicine

## 2019-09-17 ENCOUNTER — Ambulatory Visit: Payer: Self-pay

## 2019-09-17 ENCOUNTER — Other Ambulatory Visit: Payer: Self-pay

## 2019-09-17 VITALS — BP 132/78 | HR 69 | Ht 70.0 in | Wt 204.0 lb

## 2019-09-17 DIAGNOSIS — M25512 Pain in left shoulder: Secondary | ICD-10-CM

## 2019-09-17 DIAGNOSIS — M19012 Primary osteoarthritis, left shoulder: Secondary | ICD-10-CM

## 2019-09-17 MED ORDER — PREDNISONE 5 MG PO TABS: ORAL_TABLET | ORAL | 0 refills | Status: DC

## 2019-09-17 NOTE — Progress Notes (Signed)
Stephen Wong - 59 y.o. male MRN [id number]  Date of birth: April 23, 1961  SUBJECTIVE:  Including CC & ROS.  Chief Complaint  Patient presents with  . Shoulder Pain    left shoulder x 1 month    Stephen Wong is a 59 y.o. male that is presenting with acute left shoulder pain.  The pain is over the lateral aspect.  He hurt it while he was doing a bench press.  He was lifting fairly heavy during this time.  Denies any history of similar symptoms.  He has been taking ibuprofen for the pain.  Denies any ecchymosis.  No weakness.  Feels pain with certain movements.   Review of Systems See HPI   HISTORY: Past Medical, Surgical, Social, and Family History Reviewed & Updated per EMR.   Pertinent Historical Findings include:  Past Medical History:  Diagnosis Date  . Substance abuse (HCC)    Lead to incarceration, now 20 years sober from cocaine and alcohol.  . 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    No past surgical history on file.  Family History  Problem Relation Age of Onset  . Drug abuse Father   . Alcohol abuse Father   . Heart disease Father   . Early death Father   . Heart disease Sister 27       CAD with MI  . Early death Sister   . Cancer Maternal Aunt        throat  . Cancer Maternal Uncle   . Heart disease Brother        heart transplant list, CHF    Social History   Socioeconomic History  . Marital status: Single    Spouse name: Not on file  . Number of children: Not on file  . Years of education: Not on file  . Highest education level: Not on file  Occupational History  . Not on file  Tobacco Use  . Smoking status: Former Games developer  . Smokeless tobacco: Never Used  Substance and Sexual Activity  . Alcohol use: No    Comment: 20 years sober, does not attend AA meetings at this time.  . Drug use: No    Types: Cocaine    Comment: Has not used any cocaine in the last 20 years.  . Sexual activity: Never  Other  Topics Concern  . Not on file  Social History Narrative  . Not on file   Social Determinants of Health   Financial Resource Strain:   . Difficulty of Paying Living Expenses: Not on file  Food Insecurity:   . Worried About Programme researcher, broadcasting/film/video in the Last Year: Not on file  . Ran Out of Food in the Last Year: Not on file  Transportation Needs:   . Lack of Transportation (Medical): Not on file  . Lack of Transportation (Non-Medical): Not on file  Physical Activity:   . Days of Exercise per Week: Not on file  . Minutes of Exercise per Session: Not on file  Stress:   . Feeling of Stress : Not on file  Social Connections:   . Frequency of Communication with Friends and Family: Not on file  . Frequency of Social Gatherings with Friends and Family: Not on file  . Attends Religious Services: Not on file  . Active Member of Clubs or Organizations: Not on file  . Attends Banker Meetings: Not on file  . Marital Status: Not  on file  Intimate Partner Violence:   . Fear of Current or Ex-Partner: Not on file  . Emotionally Abused: Not on file  . Physically Abused: Not on file  . Sexually Abused: Not on file     PHYSICAL EXAM:  VS: BP 132/78   Pulse 69   Ht 5\' 10"  (1.778 m)   Wt 204 lb (92.5 kg)   BMI 29.27 kg/m  Physical Exam Gen: NAD, alert, cooperative with exam, well-appearing MSK:  Left shoulder: Normal range of motion in flexion and abduction. Normal external rotation. Normal strength resistance. Normal empty can test. Positive speeds test. Mild pain with O'Brien's test. Neurovascularly intact   Limited ultrasound: Left shoulder:  Normal-appearing biceps tendon. Normal-appearing subscapularis. There is a small irregularity at the footprint of the supraspinatus which could indicate a partial tear.  There was some vascularity in this area. Normal-appearing posterior glenohumeral joint. Geyser sign of the [facility] joint with degenerative changes  Summary:  Findings suggestive of partial small tear of the supraspinatus.  AC joint with effusion and degenerative changes.  Ultrasound and interpretation by Clearance Coots, MD   ASSESSMENT & PLAN:   Degenerative joint disease of left acromioclavicular joint He felt the pain initially while he was lifting on a bench press.  This would be more in line with the [facility] joint.  No noticeable changes of the joint.  Did have some changes of the supraspinatus and unclear if this contributing or not. -Prednisone. -Counseled on home exercise therapy and supportive care. -Could consider imaging or nitro or injection if pain does not improve.

## 2019-09-17 NOTE — Patient Instructions (Signed)
Nice to meet you Please try the exercises  Please try ice  Please switch to dumbbells instead of the barbell  Please send me a message in MyChart with any questions or updates.  Please see me back in 4-6 weeks.   --Dr. Jordan Likes

## 2019-09-17 NOTE — Assessment & Plan Note (Signed)
He felt the pain initially while he was lifting on a bench press.  This would be more in line with the [facility] joint.  No noticeable changes of the joint.  Did have some changes of the supraspinatus and unclear if this contributing or not. -Prednisone. -Counseled on home exercise therapy and supportive care. -Could consider imaging or nitro or injection if pain does not improve.

## 2019-10-15 ENCOUNTER — Ambulatory Visit: Payer: Self-pay | Admitting: Family Medicine

## 2019-10-17 ENCOUNTER — Encounter: Payer: Self-pay | Admitting: Nurse Practitioner

## 2019-10-17 ENCOUNTER — Other Ambulatory Visit: Payer: Self-pay

## 2019-10-17 ENCOUNTER — Ambulatory Visit: Payer: Self-pay | Admitting: Family Medicine

## 2019-10-17 ENCOUNTER — Ambulatory Visit (INDEPENDENT_AMBULATORY_CARE_PROVIDER_SITE_OTHER): Payer: 59 | Admitting: Nurse Practitioner

## 2019-10-17 VITALS — Ht 70.0 in | Wt 204.0 lb

## 2019-10-17 DIAGNOSIS — J069 Acute upper respiratory infection, unspecified: Secondary | ICD-10-CM | POA: Diagnosis not present

## 2019-10-17 MED ORDER — ACETAMINOPHEN 500 MG PO TABS: 500.0000 mg | ORAL_TABLET | Freq: Four times a day (QID) | ORAL | 0 refills | Status: AC | PRN

## 2019-10-17 MED ORDER — GUAIFENESIN-DM 100-10 MG/5ML PO SYRP: 5.0000 mL | ORAL_SOLUTION | ORAL | 0 refills | Status: DC | PRN

## 2019-10-17 MED ORDER — BENZONATATE 100 MG PO CAPS: 100.0000 mg | ORAL_CAPSULE | Freq: Three times a day (TID) | ORAL | 0 refills | Status: DC | PRN

## 2019-10-17 NOTE — Progress Notes (Deleted)
  Stephen Wong - 59 y.o. male MRN [id number]  Date of birth: Aug 19, 1960  SUBJECTIVE:  Including CC & ROS.  No chief complaint on file.   Stephen Wong is a 59 y.o. male that is  ***.  ***   Review of Systems See HPI   HISTORY: Past Medical, Surgical, Social, and Family History Reviewed & Updated per EMR.   Pertinent Historical Findings include:  Past Medical History:  Diagnosis Date  . Substance abuse (HCC)    Lead to incarceration, now 20 years sober from cocaine and alcohol.  . 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    No past surgical history on file.  Family History  Problem Relation Age of Onset  . Drug abuse Father   . Alcohol abuse Father   . Heart disease Father   . Early death Father   . Heart disease Sister 109       CAD with MI  . Early death Sister   . Cancer Maternal Aunt        throat  . Cancer Maternal Uncle   . Heart disease Brother        heart transplant list, CHF    Social History   Socioeconomic History  . Marital status: Single    Spouse name: Not on file  . Number of children: Not on file  . Years of education: Not on file  . Highest education level: Not on file  Occupational History  . Not on file  Tobacco Use  . Smoking status: Former Games developer  . Smokeless tobacco: Never Used  Substance and Sexual Activity  . Alcohol use: No    Comment: 20 years sober, does not attend AA meetings at this time.  . Drug use: No    Types: Cocaine    Comment: Has not used any cocaine in the last 20 years.  . Sexual activity: Never  Other Topics Concern  . Not on file  Social History Narrative  . Not on file   Social Determinants of Health   Financial Resource Strain:   . Difficulty of Paying Living Expenses:   Food Insecurity:   . Worried About Programme researcher, broadcasting/film/video in the Last Year:   . Barista in the Last Year:   Transportation Needs:   . Freight forwarder (Medical):   Marland Kitchen Lack of  Transportation (Non-Medical):   Physical Activity:   . Days of Exercise per Week:   . Minutes of Exercise per Session:   Stress:   . Feeling of Stress :   Social Connections:   . Frequency of Communication with Friends and Family:   . Frequency of Social Gatherings with Friends and Family:   . Attends Religious Services:   . Active Member of Clubs or Organizations:   . Attends Banker Meetings:   Marland Kitchen Marital Status:   Intimate Partner Violence:   . Fear of Current or Ex-Partner:   . Emotionally Abused:   Marland Kitchen Physically Abused:   . Sexually Abused:      PHYSICAL EXAM:  VS: There were no vitals taken for this visit. Physical Exam Gen: NAD, alert, cooperative with exam, well-appearing MSK:  ***      ASSESSMENT & PLAN:   No problem-specific Assessment & Plan notes found for this encounter.

## 2019-10-17 NOTE — Patient Instructions (Addendum)
Call [phone] to schedule appt for COVID test. Work note provided. Go to hospital or urgent care if symptoms worsen.  Encourage adequate oral hydration.  Use over-the-counter  "cold" medicines  such as "Tylenol cold" , "Advil cold",  "Mucinex" or" Mucinex D"  for cough and congestion.  Avoid decongestants if you have high blood pressure. Use" Delsym" or" Robitussin" cough syrup varietis for cough.  You can use plain "Tylenol" or "Advi"l for fever, chills and achyness.   "Common cold" symptoms are usually triggered by a virus.  The antibiotics are usually not necessary. On average, a" viral cold" illness would take 4-7 days to resolve. Please, make an appointment if you are not better or if you're worse.

## 2019-10-17 NOTE — Progress Notes (Signed)
Virtual Visit via Video Note  I connected with@ on 10/17/19 at  2:00 PM EDT by a video enabled telemedicine application and verified that I am speaking with the correct person using two identifiers.  Location: Patient:Home Provider: Office Participants: patient and provider   I discussed the limitations of evaluation and management by telemedicine and the availability of in person appointments. I also discussed with the patient that there may be a patient responsible charge related to this service. The patient expressed understanding and agreed to proceed.  CC:Pt stated he had his first Pfzer covid vaccine March 4th//last 2 days he has had body aches, fatigue, and chills//pt concerned its from vaccine//pt has no means of getting vitals  History of Present Illness: URI  This is a new problem. The current episode started yesterday. The problem has been gradually worsening. The maximum temperature recorded prior to his arrival was 100.4 - 100.9 F. The fever has been present for 1 to 2 days. Associated symptoms include congestion, coughing, headaches, joint pain, a plugged ear sensation, rhinorrhea, sinus pain and a sore throat. Pertinent negatives include no abdominal pain, chest pain, diarrhea, dysuria, ear pain, joint swelling, nausea, neck pain, rash, sneezing, swollen glands, vomiting or wheezing. He has tried NSAIDs for the symptoms. The treatment provided mild relief.   Observations/Objective: Physical Exam  Constitutional: He is oriented to person, place, and time. No distress.  Pulmonary/Chest: Effort normal.  Neurological: He is alert and oriented to person, place, and time.  Psychiatric: He has a normal mood and affect. His behavior is normal. Thought content normal.   Assessment and Plan: Mason was seen today for chills.  Diagnoses and all orders for this visit:  Viral upper respiratory tract infection -     benzonatate (TESSALON) 100 MG capsule; Take 1 capsule (100 mg total)  by mouth 3 (three) times daily as needed for cough. -     guaiFENesin-dextromethorphan (ROBITUSSIN DM) 100-10 MG/5ML syrup; Take 5 mLs by mouth every 4 (four) hours as needed for cough. -     acetaminophen (TYLENOL) 500 MG tablet; Take 1 tablet (500 mg total) by mouth every 6 (six) hours as needed.   Follow Up Instructions: See avs   I discussed the assessment and treatment plan with the patient. The patient was provided an opportunity to ask questions and all were answered. The patient agreed with the plan and demonstrated an understanding of the instructions.   The patient was advised to call back or seek an in-person evaluation if the symptoms worsen or if the condition fails to improve as anticipated.   Alysia Penna, NP

## 2019-10-20 ENCOUNTER — Ambulatory Visit: Payer: 59 | Attending: Internal Medicine

## 2019-10-22 ENCOUNTER — Ambulatory Visit: Payer: Self-pay | Admitting: Family Medicine

## 2019-10-22 NOTE — Telephone Encounter (Signed)
Pt stated he does not need the note now. Pt stated he went back to work on 10/20/2019 with absence of any symptoms.    Pt was wondering when is a good time for him to come in and get a CPE with Claris Gower? Please advise.

## 2019-10-22 NOTE — Progress Notes (Deleted)
  Stephen Wong - 59 y.o. male MRN [id number]  Date of birth: 11/30/1960  SUBJECTIVE:  Including CC & ROS.  No chief complaint on file.   Stephen Wong is a 59 y.o. male that is  ***.  ***   Review of Systems See HPI   HISTORY: Past Medical, Surgical, Social, and Family History Reviewed & Updated per EMR.   Pertinent Historical Findings include:  Past Medical History:  Diagnosis Date  . Substance abuse (HCC)    Lead to incarceration, now 20 years sober from cocaine and alcohol.  . 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    No past surgical history on file.  Family History  Problem Relation Age of Onset  . Drug abuse Father   . Alcohol abuse Father   . Heart disease Father   . Early death Father   . Heart disease Sister 51       CAD with MI  . Early death Sister   . Cancer Maternal Aunt        throat  . Cancer Maternal Uncle   . Heart disease Brother        heart transplant list, CHF    Social History   Socioeconomic History  . Marital status: Single    Spouse name: Not on file  . Number of children: Not on file  . Years of education: Not on file  . Highest education level: Not on file  Occupational History  . Not on file  Tobacco Use  . Smoking status: Former Smoker  . Smokeless tobacco: Never Used  Substance and Sexual Activity  . Alcohol use: No    Comment: 20 years sober, does not attend AA meetings at this time.  . Drug use: No    Types: Cocaine    Comment: Has not used any cocaine in the last 20 years.  . Sexual activity: Never  Other Topics Concern  . Not on file  Social History Narrative  . Not on file   Social Determinants of Health   Financial Resource Strain:   . Difficulty of Paying Living Expenses:   Food Insecurity:   . Worried About Running Out of Food in the Last Year:   . Ran Out of Food in the Last Year:   Transportation Needs:   . Lack of Transportation (Medical):   . Lack of  Transportation (Non-Medical):   Physical Activity:   . Days of Exercise per Week:   . Minutes of Exercise per Session:   Stress:   . Feeling of Stress :   Social Connections:   . Frequency of Communication with Friends and Family:   . Frequency of Social Gatherings with Friends and Family:   . Attends Religious Services:   . Active Member of Clubs or Organizations:   . Attends Club or Organization Meetings:   . Marital Status:   Intimate Partner Violence:   . Fear of Current or Ex-Partner:   . Emotionally Abused:   . Physically Abused:   . Sexually Abused:      PHYSICAL EXAM:  VS: There were no vitals taken for this visit. Physical Exam Gen: NAD, alert, cooperative with exam, well-appearing MSK:  ***      ASSESSMENT & PLAN:   No problem-specific Assessment & Plan notes found for this encounter.     

## 2019-12-05 LAB — HM DIABETES EYE EXAM

## 2019-12-15 ENCOUNTER — Ambulatory Visit (INDEPENDENT_AMBULATORY_CARE_PROVIDER_SITE_OTHER): Payer: 59 | Admitting: Nurse Practitioner

## 2019-12-15 ENCOUNTER — Encounter: Payer: Self-pay | Admitting: Nurse Practitioner

## 2019-12-15 ENCOUNTER — Other Ambulatory Visit: Payer: Self-pay

## 2019-12-15 VITALS — BP 118/60 | HR 67 | Temp 96.3°F | Ht 70.0 in | Wt 193.4 lb

## 2019-12-15 DIAGNOSIS — E782 Mixed hyperlipidemia: Secondary | ICD-10-CM

## 2019-12-15 DIAGNOSIS — Z125 Encounter for screening for malignant neoplasm of prostate: Secondary | ICD-10-CM | POA: Diagnosis not present

## 2019-12-15 DIAGNOSIS — E1165 Type 2 diabetes mellitus with hyperglycemia: Secondary | ICD-10-CM

## 2019-12-15 DIAGNOSIS — Z0000 Encounter for general adult medical examination without abnormal findings: Secondary | ICD-10-CM | POA: Diagnosis not present

## 2019-12-15 DIAGNOSIS — Z0001 Encounter for general adult medical examination with abnormal findings: Secondary | ICD-10-CM

## 2019-12-15 DIAGNOSIS — Z23 Encounter for immunization: Secondary | ICD-10-CM

## 2019-12-15 LAB — COMPREHENSIVE METABOLIC PANEL
ALT: 24 U/L (ref 0–53)
AST: 15 U/L (ref 0–37)
Albumin: 4.6 g/dL (ref 3.5–5.2)
Alkaline Phosphatase: 74 U/L (ref 39–117)
BUN: 12 mg/dL (ref 6–23)
CO2: 25 mEq/L (ref 19–32)
Calcium: 9.5 mg/dL (ref 8.4–10.5)
Chloride: 104 mEq/L (ref 96–112)
Creatinine, Ser: 0.96 mg/dL (ref 0.40–1.50)
GFR: 97.23 mL/min (ref >60.00)
Glucose, Bld: 100 mg/dL — ABNORMAL HIGH (ref 70–99)
Potassium: 4.5 mEq/L (ref 3.5–5.1)
Sodium: 136 mEq/L (ref 135–145)
Total Bilirubin: 0.4 mg/dL (ref 0.2–1.2)
Total Protein: 7.3 g/dL (ref 6.0–8.3)

## 2019-12-15 LAB — CBC
HCT: 43.7 % (ref 39.0–52.0)
Hemoglobin: 14.4 g/dL (ref 13.0–17.0)
MCHC: 33.1 g/dL (ref 30.0–36.0)
MCV: 90.3 fl (ref 78.0–100.0)
Platelets: 281 10*3/uL (ref 150.0–400.0)
RBC: 4.84 Mil/uL (ref 4.22–5.81)
RDW: 13 % (ref 11.5–15.5)
WBC: 7.6 10*3/uL (ref 4.0–10.5)

## 2019-12-15 LAB — HEMOGLOBIN A1C: Hgb A1c MFr Bld: 6.3 % (ref 4.6–6.5)

## 2019-12-15 LAB — MICROALBUMIN / CREATININE URINE RATIO
Creatinine,U: 235.9 mg/dL
Microalb Creat Ratio: 0.3 mg/g (ref 0.0–30.0)
Microalb, Ur: <0.7 mg/dL (ref 0.0–1.9)

## 2019-12-15 LAB — LIPID PANEL
Cholesterol: 166 mg/dL (ref 0–200)
HDL: 47 mg/dL (ref >39.00)
LDL Cholesterol: 87 mg/dL (ref 0–99)
NonHDL: 119.41
Total CHOL/HDL Ratio: 4
Triglycerides: 164 mg/dL — ABNORMAL HIGH (ref 0.0–149.0)
VLDL: 32.8 mg/dL (ref 0.0–40.0)

## 2019-12-15 NOTE — Patient Instructions (Addendum)
Normal PSA, CBC, TSH, urine microalbumin, renal and liver function. Improved HbgA1c at 6.3. no need for medication at this time Abnormal lipid panel due to elevated trig.  Maintain DASH diet and regular exercise to improve lipid panel and maintain proper glucose control. I entered referral to nutritionist to help you better understand diabetes and necessary lifestyle. F/up in 2months. Diabetes Mellitus and Nutrition, Adult When you have diabetes (diabetes mellitus), it is very important to have healthy eating habits because your blood sugar (glucose) levels are greatly affected by what you eat and drink. Eating healthy foods in the appropriate amounts, at about the same times every day, can help you:  Control your blood glucose.  Lower your risk of heart disease.  Improve your blood pressure.  Reach or maintain a healthy weight. Every person with diabetes is different, and each person has different needs for a meal plan. Your health care provider may recommend that you work with a diet and nutrition specialist (dietitian) to make a meal plan that is best for you. Your meal plan may vary depending on factors such as:  The calories you need.  The medicines you take.  Your weight.  Your blood glucose, blood pressure, and cholesterol levels.  Your activity level.  Other health conditions you have, such as heart or kidney disease. How do carbohydrates affect me? Carbohydrates, also called carbs, affect your blood glucose level more than any other type of food. Eating carbs naturally raises the amount of glucose in your blood. Carb counting is a method for keeping track of how many carbs you eat. Counting carbs is important to keep your blood glucose at a healthy level, especially if you use insulin or take certain oral diabetes medicines. It is important to know how many carbs you can safely have in each meal. This is different for every person. Your dietitian can help you calculate how  many carbs you should have at each meal and for each snack. Foods that contain carbs include:  Bread, cereal, rice, pasta, and crackers.  Potatoes and corn.  Peas, beans, and lentils.  Milk and yogurt.  Fruit and juice.  Desserts, such as cakes, cookies, ice cream, and candy. How does alcohol affect me? Alcohol can cause a sudden decrease in blood glucose (hypoglycemia), especially if you use insulin or take certain oral diabetes medicines. Hypoglycemia can be a life-threatening condition. Symptoms of hypoglycemia (sleepiness, dizziness, and confusion) are similar to symptoms of having too much alcohol. If your health care provider says that alcohol is safe for you, follow these guidelines:  Limit alcohol intake to no more than 1 drink per day for nonpregnant women and 2 drinks per day for men. One drink equals 12 oz of beer, 5 oz of wine, or 1 oz of hard liquor.  Do not drink on an empty stomach.  Keep yourself hydrated with water, diet soda, or unsweetened iced tea.  Keep in mind that regular soda, juice, and other mixers may contain a lot of sugar and must be counted as carbs. What are tips for following this plan?  Reading food labels  Start by checking the serving size on the "Nutrition Facts" label of packaged foods and drinks. The amount of calories, carbs, fats, and other nutrients listed on the label is based on one serving of the item. Many items contain more than one serving per package.  Check the total grams (g) of carbs in one serving. You can calculate the number of servings of carbs  in one serving by dividing the total carbs by 15. For example, if a food has 30 g of total carbs, it would be equal to 2 servings of carbs.  Check the number of grams (g) of saturated and trans fats in one serving. Choose foods that have low or no amount of these fats.  Check the number of milligrams (mg) of salt (sodium) in one serving. Most people should limit total sodium intake to  less than 2,300 mg per day.  Always check the nutrition information of foods labeled as "low-fat" or "nonfat". These foods may be higher in added sugar or refined carbs and should be avoided.  Talk to your dietitian to identify your daily goals for nutrients listed on the label. Shopping  Avoid buying canned, premade, or processed foods. These foods tend to be high in fat, sodium, and added sugar.  Shop around the outside edge of the grocery store. This includes fresh fruits and vegetables, bulk grains, fresh meats, and fresh dairy. Cooking  Use low-heat cooking methods, such as baking, instead of high-heat cooking methods like deep frying.  Cook using healthy oils, such as olive, canola, or sunflower oil.  Avoid cooking with butter, cream, or high-fat meats. Meal planning  Eat meals and snacks regularly, preferably at the same times every day. Avoid going long periods of time without eating.  Eat foods high in fiber, such as fresh fruits, vegetables, beans, and whole grains. Talk to your dietitian about how many servings of carbs you can eat at each meal.  Eat 4-6 ounces (oz) of lean protein each day, such as lean meat, chicken, fish, eggs, or tofu. One oz of lean protein is equal to: ? 1 oz of meat, chicken, or fish. ? 1 egg. ?  cup of tofu.  Eat some foods each day that contain healthy fats, such as avocado, nuts, seeds, and fish. Lifestyle  Check your blood glucose regularly.  Exercise regularly as told by your health care provider. This may include: ? 150 minutes of moderate-intensity or vigorous-intensity exercise each week. This could be brisk walking, biking, or water aerobics. ? Stretching and doing strength exercises, such as yoga or weightlifting, at least 2 times a week.  Take medicines as told by your health care provider.  Do not use any products that contain nicotine or tobacco, such as cigarettes and e-cigarettes. If you need help quitting, ask your health care  provider.  Work with a Social worker or diabetes educator to identify strategies to manage stress and any emotional and social challenges. Questions to ask a health care provider  Do I need to meet with a diabetes educator?  Do I need to meet with a dietitian?  What number can I call if I have questions?  When are the best times to check my blood glucose? Where to find more information:  American Diabetes Association: diabetes.org  Academy of Nutrition and Dietetics: www.eatright.[employer] of Diabetes and Digestive and Kidney Diseases (NIH): [employer] Summary  A healthy meal plan will help you control your blood glucose and maintain a healthy lifestyle.  Working with a diet and nutrition specialist (dietitian) can help you make a meal plan that is best for you.  Keep in mind that carbohydrates (carbs) and alcohol have immediate effects on your blood glucose levels. It is important to count carbs and to use alcohol carefully. This information is not intended to replace advice given to you by your health care provider. Make sure you discuss  any questions you have with your health care provider. Document Revised: 06/29/2017 Document Reviewed: 08/21/2016 Elsevier Patient Education  2020 Elsevier Inc.  

## 2019-12-15 NOTE — Progress Notes (Signed)
Subjective:    Patient ID: Stephen Wong, male    DOB: Apr 14, 1961, 59 y.o.   MRN: [id number]  Patient presents today for complete physical and DM f/up.  HPI DM: Reports he has made changes to his diet: lowfat and low carb Last HgbA1c of 6.6 Up to date with eye exam, use of contact lens, negative DM retinopathy per patient. Negative neuropathy LDL not at goal. Needs repeat today. Last COVID vaccine 10/22/2019.  Dental:up to date, has partials  Sexual History (orientation,birth control, marital status, STD):single, sexually active  Depression/Suicide: Depression screen [facility] 2/9 12/15/2019 10/17/2019 08/31/2017  Decreased Interest 0 0 0  Down, Depressed, Hopeless 0 0 0  PHQ - 2 Score 0 0 0   Immunizations: (TDAP, Hep C screen, Pneumovax, Influenza, zoster)  Health Maintenance  Topic Date Due  . Eye exam for diabetics  Never done  . Flu Shot  02/29/2020  . Hemoglobin A1C  06/16/2020  . Complete foot exam   12/14/2020  . Urine Protein Check  12/14/2020  . Colon Cancer Screening  07/01/2022  . Tetanus Vaccine  04/13/2026  . Pneumococcal vaccine  Completed  . COVID-19 Vaccine  Completed  .  Hepatitis C: One time screening is recommended by Center for Disease Control  (CDC) for  adults born from [birth year] through 1964.   Completed  . HIV Screening  Completed   Weight:  Wt Readings from Last 3 Encounters:  12/15/19 193 lb 6.4 oz (87.7 kg)  10/17/19 204 lb (92.5 kg)  09/17/19 204 lb (92.5 kg)   Fall Risk: Fall Risk  12/15/2019 10/17/2019 08/31/2017  Falls in the past year? 0 0 No  Number falls in past yr: 0 0 -  Injury with Fall? 0 0 -   Medications and allergies reviewed with patient and updated if appropriate.  Patient Active Problem List   Diagnosis Date Noted  . Degenerative joint disease of left acromioclavicular joint 09/17/2019  . Male erectile disorder 08/31/2017  . FHx: cancer of prostate 04/13/2016  . Hyperlipidemia, mixed 04/13/2016  . Hyperglycemia 04/13/2016     Current Outpatient Medications on File Prior to Visit  Medication Sig Dispense Refill  . acetaminophen (TYLENOL) 500 MG tablet Take 1 tablet (500 mg total) by mouth every 6 (six) hours as needed. 30 tablet 0  . Diclofenac Sodium (PENNSAID) 2 % SOLN Place 1 application onto the skin 2 times daily at 12 noon and 4 pm. 2 g 0  . sildenafil (VIAGRA) 50 MG tablet Take 1 tablet (50 mg total) by mouth daily as needed for erectile dysfunction. 10 tablet 0  . benzonatate (TESSALON) 100 MG capsule Take 1 capsule (100 mg total) by mouth 3 (three) times daily as needed for cough. (Patient not taking: Reported on 12/15/2019) 20 capsule 0  . guaiFENesin-dextromethorphan (ROBITUSSIN DM) 100-10 MG/5ML syrup Take 5 mLs by mouth every 4 (four) hours as needed for cough. (Patient not taking: Reported on 12/15/2019) 118 mL 0   No current facility-administered medications on file prior to visit.    Past Medical History:  Diagnosis Date  . Substance abuse (HCC)    Lead to incarceration, now 20 years sober from cocaine and alcohol.  . 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    History reviewed. No pertinent surgical history.  Social History   Socioeconomic History  . Marital status: Single    Spouse name: Not on file  . Number of children: Not on file  .  Years of education: Not on file  . Highest education level: Not on file  Occupational History  . Not on file  Tobacco Use  . Smoking status: Former Games developer  . Smokeless tobacco: Never Used  Substance and Sexual Activity  . Alcohol use: No    Comment: 20 years sober, does not attend AA meetings at this time.  . Drug use: No    Types: Cocaine    Comment: Has not used any cocaine in the last 20 years.  . Sexual activity: Never  Other Topics Concern  . Not on file  Social History Narrative  . Not on file   Social Determinants of Health   Financial Resource Strain:   . Difficulty of Paying Living  Expenses:   Food Insecurity:   . Worried About Programme researcher, broadcasting/film/video in the Last Year:   . Barista in the Last Year:   Transportation Needs:   . Freight forwarder (Medical):   Marland Kitchen Lack of Transportation (Non-Medical):   Physical Activity:   . Days of Exercise per Week:   . Minutes of Exercise per Session:   Stress:   . Feeling of Stress :   Social Connections:   . Frequency of Communication with Friends and Family:   . Frequency of Social Gatherings with Friends and Family:   . Attends Religious Services:   . Active Member of Clubs or Organizations:   . Attends Banker Meetings:   Marland Kitchen Marital Status:     Family History  Problem Relation Age of Onset  . Drug abuse Father   . Alcohol abuse Father   . Heart disease Father   . Early death Father   . Heart disease Sister 82       CAD with MI  . Early death Sister   . Cancer Maternal Aunt        throat  . Cancer Maternal Uncle   . Heart disease Brother        heart transplant list, CHF        Review of Systems  Constitutional: Negative for fever, malaise/fatigue and weight loss.  HENT: Negative for congestion and sore throat.   Eyes:       Negative for visual changes  Respiratory: Negative for cough and shortness of breath.   Cardiovascular: Negative for chest pain, palpitations and leg swelling.  Gastrointestinal: Negative for blood in stool, constipation, diarrhea and heartburn.  Genitourinary: Negative for dysuria, frequency and urgency.  Musculoskeletal: Negative for falls, joint pain and myalgias.  Skin: Negative for rash.  Neurological: Negative for dizziness, sensory change and headaches.  Endo/Heme/Allergies: Does not bruise/bleed easily.  Psychiatric/Behavioral: Negative for depression, substance abuse and suicidal ideas. The patient is not nervous/anxious.     Objective:   Vitals:   12/15/19 1140  BP: 118/60  Pulse: 67  Temp: (!) 96.3 F (35.7 C)  SpO2: 96%    Body mass index  is 27.75 kg/m.   Physical Examination:  Physical Exam Vitals reviewed.  Constitutional:      General: He is not in acute distress.    Appearance: He is well-developed.  HENT:     Right Ear: Tympanic membrane, ear canal and external ear normal.     Left Ear: Tympanic membrane, ear canal and external ear normal.  Eyes:     Extraocular Movements: Extraocular movements intact.     Conjunctiva/sclera: Conjunctivae normal.  Cardiovascular:     Rate and Rhythm: Normal rate  and regular rhythm.     Pulses: Normal pulses.     Heart sounds: Normal heart sounds.  Pulmonary:     Effort: Pulmonary effort is normal. No respiratory distress.     Breath sounds: Normal breath sounds.  Chest:     Chest wall: No tenderness.  Abdominal:     General: Bowel sounds are normal.     Palpations: Abdomen is soft.  Genitourinary:    Comments: declined Musculoskeletal:        General: Normal range of motion.     Cervical back: Normal range of motion and neck supple.  Lymphadenopathy:     Cervical: No cervical adenopathy.  Skin:    General: Skin is warm and dry.     Comments: Normal diabetic foot exam  Neurological:     Mental Status: He is alert and oriented to person, place, and time.     Deep Tendon Reflexes: Reflexes are normal and symmetric.  Psychiatric:        Mood and Affect: Mood normal.        Behavior: Behavior normal.        Thought Content: Thought content normal.     ASSESSMENT and PLAN: This visit occurred during the SARS-CoV-2 public health emergency.  Safety protocols were in place, including screening questions prior to the visit, additional usage of staff PPE, and extensive cleaning of exam room while observing appropriate contact time as indicated for disinfecting solutions.   Trelon was seen today for annual exam.  Diagnoses and all orders for this visit:  Encounter for preventative adult health care exam with abnormal findings -     PSA -     Comprehensive metabolic  panel -     CBC -     TSH  Hyperlipidemia, mixed -     Lipid panel  Type 2 diabetes mellitus with hyperglycemia, without long-term current use of insulin (HCC) -     Hemoglobin A1c -     Microalbumin / creatinine urine ratio -     Referral to Nutrition and Diabetes Services  Prostate cancer screening -     PSA  Need for 23-polyvalent pneumococcal polysaccharide vaccine -     Pneumococcal polysaccharide vaccine 23-valent greater than or equal to 2yo subcutaneous/IM    No problem-specific Assessment & Plan notes found for this encounter.      Problem List Items Addressed This Visit      Other   Hyperglycemia   Hyperlipidemia, mixed   Relevant Orders   Lipid panel (Completed)    Other Visit Diagnoses    Encounter for preventative adult health care exam with abnormal findings    -  Primary   Relevant Orders   PSA (Completed)   Comprehensive metabolic panel (Completed)   CBC (Completed)   TSH (Completed)   Prostate cancer screening       Relevant Orders   PSA (Completed)   Need for 23-polyvalent pneumococcal polysaccharide vaccine       Relevant Orders   Pneumococcal polysaccharide vaccine 23-valent greater than or equal to 2yo subcutaneous/IM (Completed)      Follow up: Return in about 3 months (around 03/16/2020) for DM.  Wilfred Lacy, NP

## 2019-12-16 LAB — TSH: TSH: 2.16 u[IU]/mL (ref 0.35–4.50)

## 2019-12-16 LAB — PSA: PSA: 0.5 ng/mL (ref 0.10–4.00)

## 2019-12-16 LAB — HM DIABETES EYE EXAM

## 2019-12-19 NOTE — Assessment & Plan Note (Signed)
Elevated triglyceride Advised about needed lifestyle changes. Entered referral to nutritionist. Repeat lipid panel in 33months

## 2019-12-19 NOTE — Assessment & Plan Note (Signed)
Controlled. Normal urine microalbumin and renal Improved HbgA1c at 6.3. no need for medication at this time

## 2019-12-25 LAB — HM DIABETES EYE EXAM

## 2020-02-04 ENCOUNTER — Ambulatory Visit: Payer: 59 | Admitting: Registered"

## 2020-02-09 ENCOUNTER — Encounter: Payer: Self-pay | Admitting: Medical

## 2020-02-09 ENCOUNTER — Other Ambulatory Visit: Payer: Self-pay

## 2020-02-09 ENCOUNTER — Ambulatory Visit (INDEPENDENT_AMBULATORY_CARE_PROVIDER_SITE_OTHER): Payer: 59 | Admitting: Medical

## 2020-02-09 VITALS — BP 123/74 | HR 70 | Temp 98.1°F | Resp 18 | Ht 70.0 in | Wt 197.0 lb

## 2020-02-09 DIAGNOSIS — M7581 Other shoulder lesions, right shoulder: Secondary | ICD-10-CM

## 2020-02-09 DIAGNOSIS — L089 Local infection of the skin and subcutaneous tissue, unspecified: Secondary | ICD-10-CM | POA: Diagnosis not present

## 2020-02-09 DIAGNOSIS — F439 Reaction to severe stress, unspecified: Secondary | ICD-10-CM | POA: Diagnosis not present

## 2020-02-09 MED ORDER — CEPHALEXIN 500 MG PO CAPS
500.0000 mg | ORAL_CAPSULE | Freq: Two times a day (BID) | ORAL | 0 refills | Status: DC
Start: 2020-02-09 — End: 2020-03-16

## 2020-02-09 NOTE — Patient Instructions (Signed)
For rt side pectoral region pain/likely tendinitis recommend ibuprofen 200-600 mg every 8 hours as needed for pain.  For slight scalp will prescribe keflex antibiotic for 7 days. Need to confirm that scalp area is no longer tender after treatment. If pain persists let me know.  Stress hopefully is resolving with 2nd job issues. If persist or worsens let us know.  Follow up 7-10 days or as needed

## 2020-02-09 NOTE — Progress Notes (Signed)
Subjective:    Patient ID: Stephen Wong, male    DOB: Aug 25, 1960, 59 y.o.   MRN: [id number]  HPI  Pt in with some rt upper chest. He points to pectoralis area medial to shoulder area. He states he had sharp pain for about 30 seconds. He states pain on rt side. Clarified since left arm pain listed on chief complaints.  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 States high strung recently.   He also has some slight tender to left side scalp.He uses electric shaver. His scalp has been tender for about 2 days. Last time he shaved was on Friday. States scalp was sore on Saturday or Sunday morning.   Review of Systems  Constitutional: Negative for chills, fatigue and fever.  Respiratory: Negative for cough, chest tightness, shortness of breath and wheezing.   Cardiovascular: Negative for chest pain and palpitations.  Musculoskeletal:       See hpi.  Skin: Negative for rash.  Neurological: Negative for dizziness, numbness and headaches.  Hematological: Negative for adenopathy. Does not bruise/bleed easily.  Psychiatric/Behavioral:       See hpi.    Past Medical History:  Diagnosis Date  . Substance abuse (HCC)    Lead to incarceration, now 20 years sober from cocaine and alcohol.  . 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     Social History   Socioeconomic History  . Marital status: Single    Spouse name: Not on file  . Number of children: Not on file  . Years of education: Not on file  . Highest education level: Not on file  Occupational History  . Not on file  Tobacco Use  . Smoking status: Former Games developer  . Smokeless tobacco: Never Used  Substance and Sexual Activity  . Alcohol use: No    Comment: 20 years sober, does not attend AA meetings at this time.  . Drug use: No    Types: Cocaine    Comment: Has not used any cocaine in the last 20 years.   . Sexual activity: Never  Other Topics Concern  . Not on file  Social History Narrative  . Not on file   Social Determinants of Health   Financial Resource Strain:   . Difficulty of Paying Living Expenses:   Food Insecurity:   . Worried About Programme researcher, broadcasting/film/video in the Last Year:   . Barista in the Last Year:   Transportation Needs:   . Freight forwarder (Medical):   Marland Kitchen Lack of Transportation (Non-Medical):   Physical Activity:   . Days of Exercise per Week:   . Minutes of Exercise per Session:   Stress:   . Feeling of Stress :   Social Connections:   . Frequency of Communication with Friends and Family:   . Frequency of Social Gatherings with Friends and Family:   . Attends Religious Services:   . Active Member of Clubs or Organizations:   . Attends Banker Meetings:   Marland Kitchen Marital Status:   Intimate Partner Violence:   . Fear of Current or Ex-Partner:   . Emotionally Abused:   Marland Kitchen Physically Abused:   . Sexually Abused:     No past surgical history on file.  Family History  Problem Relation Age of Onset  . Drug abuse Father   . Alcohol abuse  Father   . Heart disease Father   . Early death Father   . Heart disease Sister 67       CAD with MI  . Early death Sister   . Cancer Maternal Aunt        throat  . Cancer Maternal Uncle   . Heart disease Brother        heart transplant list, CHF    No Known Allergies  Current Outpatient Medications on File Prior to Visit  Medication Sig Dispense Refill  . acetaminophen (TYLENOL) 500 MG tablet Take 1 tablet (500 mg total) by mouth every 6 (six) hours as needed. 30 tablet 0  . benzonatate (TESSALON) 100 MG capsule Take 1 capsule (100 mg total) by mouth 3 (three) times daily as needed for cough. (Patient not taking: Reported on 12/15/2019) 20 capsule 0  . Diclofenac Sodium (PENNSAID) 2 % SOLN Place 1 application onto the skin 2 times daily at 12 noon and 4 pm. (Patient not taking: Reported on  02/09/2020) 2 g 0  . guaiFENesin-dextromethorphan (ROBITUSSIN DM) 100-10 MG/5ML syrup Take 5 mLs by mouth every 4 (four) hours as needed for cough. (Patient not taking: Reported on 12/15/2019) 118 mL 0  . sildenafil (VIAGRA) 50 MG tablet Take 1 tablet (50 mg total) by mouth daily as needed for erectile dysfunction. 10 tablet 0   No current facility-administered medications on file prior to visit.    BP 123/74   Pulse 70   Temp 98.1 F (36.7 C) (Oral)   Resp 18   Ht 5\' 10"  (1.778 m)   Wt 197 lb (89.4 kg)   SpO2 100%   BMI 28.27 kg/m       Objective:   Physical Exam  General- No acute distress. Pleasant patient. Neck- Full range of motion, no jvd Lungs- Clear, even and unlabored. Heart- regular rate and rhythm. Neurologic- CNII- XII grossly intact.  Rt side chest- he has upper outer pectoralis pain on direct palpation of tendon.    Scalp- upper left side scalp is mild tender to palpation. But no redness, no swelling. No abnormality on inspection.        Assessment & Plan:  For rt side pectoral region pain/likely tendinitis recommend ibuprofen 200-600 mg every 8 hours as needed for pain.  For slight scalp will prescribe keflex antibiotic for 7 days. Need to confirm that scalp area is no longer tender after treatment. If pain persists let me know.  Stress hopefully is resolving with 2nd job issues. If persist or worsens let know.  Follow up 7-10 days or as needed  Time spent with patient today was 30  minutes which consisted of chart review, discussing diagnosis, work up, treatment and documentation.

## 2020-03-15 ENCOUNTER — Other Ambulatory Visit: Payer: Self-pay

## 2020-03-15 ENCOUNTER — Ambulatory Visit: Payer: 59 | Admitting: Registered"

## 2020-03-16 ENCOUNTER — Encounter: Payer: Self-pay | Admitting: Nurse Practitioner

## 2020-03-16 ENCOUNTER — Ambulatory Visit: Payer: 59 | Admitting: Nurse Practitioner

## 2020-03-16 VITALS — BP 128/76 | HR 69 | Temp 97.0°F | Ht 70.0 in | Wt 195.4 lb

## 2020-03-16 DIAGNOSIS — E782 Mixed hyperlipidemia: Secondary | ICD-10-CM

## 2020-03-16 DIAGNOSIS — E1165 Type 2 diabetes mellitus with hyperglycemia: Secondary | ICD-10-CM | POA: Diagnosis not present

## 2020-03-16 LAB — HEMOGLOBIN A1C: Hgb A1c MFr Bld: 6.4 % (ref 4.6–6.5)

## 2020-03-16 NOTE — Assessment & Plan Note (Signed)
Diet controlled Last hgbA1c of 6.3 BP at goal ASCVD of 13.5%, he choices not to take any statin at this time. Normal urine microalbumin Has upcoming appt with nutritionist Advised to schedule annual eye exam and maintain proper dental hygiene. Repeat HgbA1c today

## 2020-03-16 NOTE — Patient Instructions (Signed)
Maintain upcoming appt with nutritionist Go to lab for blood draw.

## 2020-03-16 NOTE — Progress Notes (Signed)
Subjective:  Patient ID: Stephen Wong, male    DOB: [birth year]  Age: 59 y.o. MRN: [id number]  CC: Follow-up (3 month f/u on DM)   HPI  DM (diabetes mellitus) (HCC) Diet controlled Last hgbA1c of 6.3 BP at goal ASCVD of 13.5%, he choices not to take any statin at this time. Normal urine microalbumin Has upcoming appt with nutritionist Advised to schedule annual eye exam and maintain proper dental hygiene. Repeat HgbA1c today  Hyperlipidemia, mixed ASCVD of 13.5%, he choices not to take any statin at this time. Lipid Panel     Component Value Date/Time   CHOL 166 12/15/2019 1231   TRIG 164.0 (H) 12/15/2019 1231   HDL 47.00 12/15/2019 1231   CHOLHDL 4 12/15/2019 1231   VLDL 32.8 12/15/2019 1231   LDLCALC 87 12/15/2019 1231   LDLDIRECT 47.0 08/31/2017 0829    Reviewed past Medical, Social and Family history today.  Outpatient Medications Prior to Visit  Medication Sig Dispense Refill  . acetaminophen (TYLENOL) 500 MG tablet Take 1 tablet (500 mg total) by mouth every 6 (six) hours as needed. 30 tablet 0  . penicillin v potassium (VEETID) 500 MG tablet Take 500 mg by mouth every 6 (six) hours.    . sildenafil (VIAGRA) 50 MG tablet Take 1 tablet (50 mg total) by mouth daily as needed for erectile dysfunction. 10 tablet 0  . benzonatate (TESSALON) 100 MG capsule Take 1 capsule (100 mg total) by mouth 3 (three) times daily as needed for cough. (Patient not taking: Reported on 12/15/2019) 20 capsule 0  . cephALEXin (KEFLEX) 500 MG capsule Take 1 capsule (500 mg total) by mouth 2 (two) times daily. (Patient not taking: Reported on 03/16/2020) 14 capsule 0  . Diclofenac Sodium (PENNSAID) 2 % SOLN Place 1 application onto the skin 2 times daily at 12 noon and 4 pm. (Patient not taking: Reported on 02/09/2020) 2 g 0  . guaiFENesin-dextromethorphan (ROBITUSSIN DM) 100-10 MG/5ML syrup Take 5 mLs by mouth every 4 (four) hours as needed for cough. (Patient not taking: Reported on 12/15/2019)  118 mL 0   No facility-administered medications prior to visit.    ROS See HPI  Objective:  BP 128/76 (BP Location: Left Arm, Patient Position: Sitting, Cuff Size: Normal)   Pulse 69   Temp (!) 97 F (36.1 C) (Temporal)   Ht 5\' 10"  (1.778 m)   Wt 195 lb 6.4 oz (88.6 kg)   SpO2 96%   BMI 28.04 kg/m   Physical Exam Vitals reviewed.  Cardiovascular:     Rate and Rhythm: Normal rate and regular rhythm.     Pulses: Normal pulses.     Heart sounds: Normal heart sounds.  Musculoskeletal:     Right lower leg: No edema.     Left lower leg: No edema.  Neurological:     Mental Status: He is alert and oriented to person, place, and time.     Assessment & Plan:  This visit occurred during the SARS-CoV-2 public health emergency.  Safety protocols were in place, including screening questions prior to the visit, additional usage of staff PPE, and extensive cleaning of exam room while observing appropriate contact time as indicated for disinfecting solutions.   Costantino was seen today for follow-up.  Diagnoses and all orders for this visit:  Type 2 diabetes mellitus with hyperglycemia, without long-term current use of insulin (HCC) -     Hemoglobin A1c  Hyperlipidemia, mixed   Problem List Items Addressed This  Visit      Endocrine   DM (diabetes mellitus) (HCC) - Primary    Diet controlled Last hgbA1c of 6.3 BP at goal ASCVD of 13.5%, he choices not to take any statin at this time. Normal urine microalbumin Has upcoming appt with nutritionist Advised to schedule annual eye exam and maintain proper dental hygiene. Repeat HgbA1c today      Relevant Orders   Hemoglobin A1c     Other   Hyperlipidemia, mixed    ASCVD of 13.5%, he choices not to take any statin at this time. Lipid Panel     Component Value Date/Time   CHOL 166 12/15/2019 1231   TRIG 164.0 (H) 12/15/2019 1231   HDL 47.00 12/15/2019 1231   CHOLHDL 4 12/15/2019 1231   VLDL 32.8 12/15/2019 1231   LDLCALC  87 12/15/2019 1231   LDLDIRECT 47.0 08/31/2017 0829           Follow-up: Return in about 6 months (around 09/16/2020) for DM and , hyperlipidemia.  Alysia Penna, NP

## 2020-03-16 NOTE — Assessment & Plan Note (Addendum)
ASCVD of 13.5%, he choices not to take any statin at this time. Lipid Panel     Component Value Date/Time   CHOL 166 12/15/2019 1231   TRIG 164.0 (H) 12/15/2019 1231   HDL 47.00 12/15/2019 1231   CHOLHDL 4 12/15/2019 1231   VLDL 32.8 12/15/2019 1231   LDLCALC 87 12/15/2019 1231   LDLDIRECT 47.0 08/31/2017 0829

## 2020-05-05 ENCOUNTER — Telehealth: Payer: Self-pay | Admitting: Nurse Practitioner

## 2020-05-05 ENCOUNTER — Other Ambulatory Visit: Payer: Self-pay

## 2020-05-05 NOTE — Telephone Encounter (Signed)
Patient called and wanted to see if Stephen Wong can send another referral to see a nutritionist  for diabetes, please advise. CB is [phone].

## 2020-05-06 ENCOUNTER — Ambulatory Visit: Payer: 59 | Admitting: Nurse Practitioner

## 2020-05-07 NOTE — Telephone Encounter (Signed)
Referral was previously placed in May. Provide him with number to call and schedule an appt

## 2020-05-11 ENCOUNTER — Other Ambulatory Visit: Payer: Self-pay

## 2020-05-12 ENCOUNTER — Encounter: Payer: Self-pay | Admitting: Nurse Practitioner

## 2020-05-12 ENCOUNTER — Ambulatory Visit (INDEPENDENT_AMBULATORY_CARE_PROVIDER_SITE_OTHER): Payer: 59

## 2020-05-12 ENCOUNTER — Ambulatory Visit: Payer: 59 | Admitting: Nurse Practitioner

## 2020-05-12 VITALS — BP 116/70 | HR 66 | Temp 97.4°F | Ht 69.0 in | Wt 196.8 lb

## 2020-05-12 DIAGNOSIS — M25542 Pain in joints of left hand: Secondary | ICD-10-CM

## 2020-05-12 NOTE — Progress Notes (Signed)
   Subjective:  Patient ID: Stephen Wong, male    DOB: 25-Apr-1961  Age: 59 y.o. MRN: [id number]  CC: Acute Visit (Pt c/o left hand swelling x3 weeks. Pt denies any known injury. Pt states he does have pain in it when he does certain movements. )  Hand Pain  The incident occurred more than 1 week ago. The incident occurred at work. The injury mechanism was a direct blow. The pain is present in the left hand. The quality of the pain is described as aching and cramping. The pain does not radiate. The pain has been constant since the incident. Associated symptoms include muscle weakness. Pertinent negatives include no chest pain, numbness or tingling. The symptoms are aggravated by movement and palpation. He has tried nothing for the symptoms.  crushing injury in past Right hand dorminant  Reviewed past Medical, Social and Family history today.  Outpatient Medications Prior to Visit  Medication Sig Dispense Refill  . acetaminophen (TYLENOL) 500 MG tablet Take 1 tablet (500 mg total) by mouth every 6 (six) hours as needed. 30 tablet 0  . penicillin v potassium (VEETID) 500 MG tablet Take 500 mg by mouth every 6 (six) hours. (Patient not taking: Reported on 05/12/2020)    . sildenafil (VIAGRA) 50 MG tablet Take 1 tablet (50 mg total) by mouth daily as needed for erectile dysfunction. (Patient not taking: Reported on 05/12/2020) 10 tablet 0   No facility-administered medications prior to visit.    ROS See HPI  Objective:  BP 116/70 (BP Location: Left Arm, Patient Position: Sitting, Cuff Size: Normal)   Pulse 66   Temp (!) 97.4 F (36.3 C) (Temporal)   Ht 5\' 9"  (1.753 m)   Wt 196 lb 12.8 oz (89.3 kg)   SpO2 98%   BMI 29.06 kg/m   Physical Exam Constitutional:      General: He is not in acute distress. Musculoskeletal:     Left hand: Swelling, tenderness and bony tenderness present. Normal range of motion. Normal strength. Normal sensation. There is no disruption of two-point  discrimination. Normal capillary refill. Normal pulse.       Hands:  Neurological:     Mental Status: He is alert.    Assessment & Plan:  This visit occurred during the SARS-CoV-2 public health emergency.  Safety protocols were in place, including screening questions prior to the visit, additional usage of staff PPE, and extensive cleaning of exam room while observing appropriate contact time as indicated for disinfecting solutions.   Stephen Wong was seen today for acute visit.  Diagnoses and all orders for this visit:  Pain involving joint of finger of left hand -     DG Hand Complete Left    Problem List Items Addressed This Visit    None    Visit Diagnoses    Pain involving joint of finger of left hand    -  Primary   Relevant Orders   DG Hand Complete Left      Follow-up: No follow-ups on file.  [name], NP

## 2020-05-12 NOTE — Patient Instructions (Signed)
Go to lab for x-ray 

## 2020-06-03 ENCOUNTER — Encounter: Payer: 59 | Attending: Nurse Practitioner | Admitting: Dietician

## 2020-06-03 ENCOUNTER — Encounter: Payer: Self-pay | Admitting: Dietician

## 2020-06-03 ENCOUNTER — Other Ambulatory Visit: Payer: Self-pay

## 2020-06-03 DIAGNOSIS — E1165 Type 2 diabetes mellitus with hyperglycemia: Secondary | ICD-10-CM | POA: Diagnosis not present

## 2020-06-03 NOTE — Progress Notes (Signed)
Diabetes Self-Management Education  Visit Type: First/Initial  Appt. Start Time: 7:30am  Appt. End Time: 8:30am  06/03/2020  [name], identified by name and date of birth, is a 59 y.o. male with a diagnosis of Diabetes: Type 2.   ASSESSMENT  Height 5' 9"  (1.753 m), weight 195 lb (88.5 kg). Body mass index is 28.8 kg/m.   Patient states he does not have family hx of diabetes. States he has never paid much attention to what he eats, and knows he does not eat 'balanced." Typical meal pattern is 1-2 meals per day plus snacks. May have breakfast ~4 times per week, and does not always eat a lunch meal. Will snack or have snack-sized meals throughout the day, such as chips, candy, or cookies. States he doesn't "eat to get full anymore" like he used to. Stays active and will exercise ~3 days per week, otherwise remains active all day long and does not sit still. We discussed the importance of having protein with meals/snacks for balance, and trying to include vegetables with meals for fiber and nutrition.    Diabetes Self-Management Education - 06/03/20 0733      Visit Information   Visit Type First/Initial      Initial Visit   Diabetes Type Type 2    Are you currently following a meal plan? No    Are you taking your medications as prescribed? No    Date Diagnosed 2019   A1c 6.6%     Health Coping   How would you rate your overall health? Good      Psychosocial Assessment   Patient Belief/Attitude about Diabetes Motivated to manage diabetes    Self-care barriers None    Self-management support Doctor's office    Patient Concerns Nutrition/Meal planning    Special Needs None    Preferred Learning Style No preference indicated    Learning Readiness Ready    How often do you need to have someone help you when you read instructions, pamphlets, or other written materials from your doctor or pharmacy? 1 - Never    What is the last grade level you completed in school? 7th       Complications   Last HgB A1C per patient/outside source 6.4 %   03/16/20 per Epic   How often do you check your blood sugar? 0 times/day (not testing)    Have you had a dilated eye exam in the past 12 months? Yes    Have you had a dental exam in the past 12 months? No    Are you checking your feet? Yes    How many days per week are you checking your feet? 3      Dietary Intake   Breakfast bacon + eggs + potatoes    Snack (morning) chips    Lunch Fritos chips    Snack (afternoon) [employer] sandwich + side    Snack (evening) cookies    Beverage(s) coffee w/ sweetened creamer & sugar, water, soda      Exercise   Exercise Type Moderate (swimming / aerobic walking)    How many days per week to you exercise? 3    How many minutes per day do you exercise? 30    Total minutes per week of exercise 90      Patient Education   Previous Diabetes Education Yes (please comment)   doctor   Disease state  Definition of diabetes, type 1 and 2, and the  diagnosis of diabetes;Explored patient's options for treatment of their diabetes;Factors that contribute to the development of diabetes    Nutrition management  Role of diet in the treatment of diabetes and the relationship between the three main macronutrients and blood glucose level;Information on hints to eating out and maintain blood glucose control.;Meal options for control of blood glucose level and chronic complications.    Physical activity and exercise  Role of exercise on diabetes management, blood pressure control and cardiac health.;Helped patient identify appropriate exercises in relation to his/her diabetes, diabetes complications and other health issue.      Individualized Goals (developed by patient)   Nutrition General guidelines for healthy choices and portions discussed    Medications Not Applicable    Monitoring  Not Applicable      Outcomes   Expected Outcomes Demonstrated interest in learning. Expect positive outcomes     Future DMSE PRN           Individualized Plan for Diabetes Self-Management Training:   Learning Objective:  Patient will have a greater understanding of diabetes self-management. Patient education plan is to attend individual and/or group sessions per assessed needs and concerns.   Expected Outcomes:  Demonstrated interest in learning. Expect positive outcomes  Education material provided: My Plate, Meal Ideas, Breakfast Ideas, Balanced Snacks  If problems or questions, patient to contact team via:  Phone and Email  Future DSME appointment: PRN

## 2020-10-04 ENCOUNTER — Other Ambulatory Visit: Payer: Self-pay

## 2020-10-04 ENCOUNTER — Encounter (HOSPITAL_COMMUNITY): Payer: Self-pay

## 2020-10-04 ENCOUNTER — Ambulatory Visit (HOSPITAL_COMMUNITY)
Admission: EM | Admit: 2020-10-04 | Discharge: 2020-10-04 | Disposition: A | Discharge status: Home / Self Care | Payer: 59 | Attending: Emergency Medicine | Admitting: Emergency Medicine

## 2020-10-04 DIAGNOSIS — Z20822 Contact with and (suspected) exposure to covid-19: Secondary | ICD-10-CM | POA: Diagnosis not present

## 2020-10-04 DIAGNOSIS — Z79899 Other long term (current) drug therapy: Secondary | ICD-10-CM | POA: Insufficient documentation

## 2020-10-04 DIAGNOSIS — R519 Headache, unspecified: Secondary | ICD-10-CM

## 2020-10-04 DIAGNOSIS — J069 Acute upper respiratory infection, unspecified: Secondary | ICD-10-CM | POA: Diagnosis not present

## 2020-10-04 DIAGNOSIS — R6883 Chills (without fever): Secondary | ICD-10-CM

## 2020-10-04 DIAGNOSIS — Z87891 Personal history of nicotine dependence: Secondary | ICD-10-CM | POA: Diagnosis not present

## 2020-10-04 LAB — SARS CORONAVIRUS 2 (TAT 6-24 HRS): SARS Coronavirus 2: NEGATIVE

## 2020-10-04 LAB — POC INFLUENZA A AND B ANTIGEN (URGENT CARE ONLY)
Influenza A Ag: NEGATIVE
Influenza B Ag: NEGATIVE

## 2020-10-04 MED ORDER — FLUTICASONE PROPIONATE 50 MCG/ACT NA SUSP: 1.0000 | Freq: Two times a day (BID) | NASAL | 2 refills | Status: AC

## 2020-10-04 NOTE — ED Triage Notes (Signed)
Pt c/o frontal HA, body aches, chills, sweating onset yesterday. Temp 99.4 axillary this morning  Denies cough, congestion, runny nose, n/v/d, SOB, CP.    Last took tylenol yesterday. Took 400mg  ibuprofen this morning at approx 0800.

## 2020-10-04 NOTE — ED Provider Notes (Signed)
[facility]    CSN: [id number] Arrival date & time: 10/04/20  0945      History   Chief Complaint Chief Complaint  Patient presents with  . Chills  . Headache    HPI Stephen Wong is a 60 y.o. male.   Patient presents with frontal headache 2/10, chills, sweats, body aches, cough starting last night. Denies congestion, rhinorrhea, sinus pressure, sneezing, shortness of breath, chest pain, abdominal changes,. Tried tylenol with no relief. Ibuprofen reduces headache pain.   Past Medical History:  Diagnosis Date  . DM (diabetes mellitus) (HCC)   . Substance abuse (HCC)    Lead to incarceration, now 20 years sober from cocaine and alcohol.  . 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    Patient Active Problem List   Diagnosis Date Noted  . Degenerative joint disease of left acromioclavicular joint 09/17/2019  . Male erectile disorder 08/31/2017  . FHx: cancer of prostate 04/13/2016  . Hyperlipidemia, mixed 04/13/2016  . DM (diabetes mellitus) (HCC) 04/13/2016    History reviewed. No pertinent surgical history.     Home Medications    Prior to Admission medications   Medication Sig Start Date End Date Taking? Authorizing Provider  acetaminophen (TYLENOL) 500 MG tablet Take 1 tablet (500 mg total) by mouth every 6 (six) hours as needed. 10/17/19  Yes Nche, Bonna Gains, NP  fluticasone (FLONASE) 50 MCG/ACT nasal spray Place 1 spray into both nostrils in the morning and at bedtime. 10/04/20  Yes White, Adrienne R, NP  penicillin v potassium (VEETID) 500 MG tablet Take 500 mg by mouth every 6 (six) hours. Patient not taking: Reported on 05/12/2020 03/11/20   [provider]  sildenafil (VIAGRA) 50 MG tablet Take 1 tablet (50 mg total) by mouth daily as needed for erectile dysfunction. Patient not taking: Reported on 05/12/2020 08/31/17   Nche, Bonna Gains, NP    Family History Family History  Problem  Relation Age of Onset  . Drug abuse Father   . Alcohol abuse Father   . Heart disease Father   . Early death Father   . Heart disease Sister 31       CAD with MI  . Early death Sister   . Cancer Maternal Aunt        throat  . Cancer Maternal Uncle   . Heart disease Brother        heart transplant list, CHF    Social History Social History   Tobacco Use  . Smoking status: Former Games developer  . Smokeless tobacco: Never Used  Substance Use Topics  . Alcohol use: No    Comment: 20 years sober, does not attend AA meetings at this time.  . Drug use: No    Types: Cocaine    Comment: Has not used any cocaine in the last 20 years.     Allergies   Patient has no known allergies.   Review of Systems Review of Systems  Constitutional: Positive for chills and diaphoresis. Negative for activity change, appetite change, fatigue, fever and unexpected weight change.  HENT: Negative.   Eyes: Negative.   Respiratory: Positive for cough. Negative for apnea, choking, chest tightness, shortness of breath, wheezing and stridor.   Cardiovascular: Negative.   Gastrointestinal: Negative.   Genitourinary: Negative.   Musculoskeletal: Negative.  Negative for joint swelling.  Neurological: Positive for headaches. Negative for dizziness, tremors, seizures, syncope, facial asymmetry, speech difficulty, weakness, light-headedness and numbness.  Physical Exam Triage Vital Signs ED Triage Vitals  Enc Vitals Group     BP 10/04/20 1055 129/80     Pulse Rate 10/04/20 1055 83     Resp 10/04/20 1055 18     Temp 10/04/20 1055 98.6 F (37 C)     Temp Source 10/04/20 1055 Oral     SpO2 10/04/20 1055 97 %     Weight --      Height --      Head Circumference --      Peak Flow --      Pain Score 10/04/20 1052 3     Pain Loc --      Pain Edu? --      Excl. in GC? --    No data found.  Updated Vital Signs BP 129/80 (BP Location: Right Arm)   Pulse 83   Temp 98.6 F (37 C) (Oral)   Resp 18    SpO2 97%   Visual Acuity Right Eye Distance:   Left Eye Distance:   Bilateral Distance:    Right Eye Near:   Left Eye Near:    Bilateral Near:     Physical Exam Constitutional:      Appearance: He is well-developed and normal weight.  HENT:     Head: Normocephalic.     Right Ear: Hearing, ear canal and external ear normal. A middle ear effusion is present.     Left Ear: Hearing, ear canal and external ear normal. A middle ear effusion is present.     Mouth/Throat:     Mouth: Mucous membranes are moist.     Pharynx: Oropharynx is clear.  Eyes:     Extraocular Movements: Extraocular movements intact.     Pupils: Pupils are equal, round, and reactive to light.  Cardiovascular:     Rate and Rhythm: Normal rate and regular rhythm.  Pulmonary:     Effort: Pulmonary effort is normal.     Breath sounds: Normal breath sounds.  Musculoskeletal:     Cervical back: Normal range of motion and neck supple.  Skin:    General: Skin is warm and dry.  Neurological:     Mental Status: He is alert.      UC Treatments / Results  Labs (all labs ordered are listed, but only abnormal results are displayed) Labs Reviewed  SARS CORONAVIRUS 2 (TAT 6-24 HRS)  INFLUENZA PANEL BY PCR (TYPE A & B)    EKG   Radiology No results found.  Procedures Procedures (including critical care time)  Medications Ordered in UC Medications - No data to display  Initial Impression / Assessment and Plan / UC Course  I have reviewed the triage vital signs and the nursing notes.  Pertinent labs & imaging results that were available during my care of the patient were reviewed by me and considered in my medical decision making (see chart for details).  VIral URI with cough  1. Flu swab- pending 2. covid swab-pending 3. Fluticasone nasal spray bid to aid with eustachian dysfunction  4. Discussed otc medications to help with symptomology  Final Clinical Impressions(s) / UC Diagnoses   Final  diagnoses:  Viral URI with cough     Discharge Instructions     Can you ibuprofen 400-600 mg every 6 hours for headache  Can use nasal spray to help with decrease fluid in ears  Can take tylenol 650 mg every 6 hours if feeling feverish  Lab results should return in 24-48  hours, will be called if positive    ED Prescriptions    Medication Sig Dispense Auth. Provider   fluticasone (FLONASE) 50 MCG/ACT nasal spray Place 1 spray into both nostrils in the morning and at bedtime. 11.1 mL Valinda Hoar, NP     PDMP not reviewed this encounter.   Valinda Hoar, NP 10/04/20 [phone]

## 2020-10-04 NOTE — Discharge Instructions (Signed)
Can you ibuprofen 400-600 mg every 6 hours for headache  Can use nasal spray to help with decrease fluid in ears  Can take tylenol 650 mg every 6 hours if feeling feverish  Lab results should return in 24-48 hours, will be called if positive

## 2020-10-04 NOTE — ED Notes (Signed)
Results- Flu A & B-Both negative

## 2020-10-07 ENCOUNTER — Ambulatory Visit (HOSPITAL_COMMUNITY)
Admission: EM | Admit: 2020-10-07 | Discharge: 2020-10-07 | Disposition: A | Discharge status: Home / Self Care | Payer: 59

## 2020-10-07 ENCOUNTER — Encounter (HOSPITAL_COMMUNITY): Payer: Self-pay

## 2020-10-07 ENCOUNTER — Other Ambulatory Visit: Payer: Self-pay

## 2020-10-07 DIAGNOSIS — J069 Acute upper respiratory infection, unspecified: Secondary | ICD-10-CM | POA: Diagnosis not present

## 2020-10-07 NOTE — ED Triage Notes (Signed)
Pt presents with chills, headaches X 4 days. Pt states he has been coughing and unable to sleep.

## 2020-10-07 NOTE — ED Provider Notes (Signed)
[facility]    CSN: [id number] Arrival date & time: 10/07/20  0920      History   Chief Complaint Chief Complaint  Patient presents with  . Headache  . Cough  . Chills    HPI Stephen Wong is a 60 y.o. male.   Patient presents with chills and 5/10 headache beginning for 5 days interfering with sleep. Headache is bilateral, frontal, constant, described as pressure.  Denies fever, cough, body aches. Sneezing, ear pain, sinus pressure, shortness of breath, chest pain, sore throat, nausea, photophobia, phonophobia. Tested negative for Covid and flu on 3/7. Attempted tylenol no relief, ibuprofen helps but does not last.   Past Medical History:  Diagnosis Date  . DM (diabetes mellitus) (HCC)   . Substance abuse (HCC)    Lead to incarceration, now 20 years sober from cocaine and alcohol.  . 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    Patient Active Problem List   Diagnosis Date Noted  . Degenerative joint disease of left acromioclavicular joint 09/17/2019  . Male erectile disorder 08/31/2017  . FHx: cancer of prostate 04/13/2016  . Hyperlipidemia, mixed 04/13/2016  . DM (diabetes mellitus) (HCC) 04/13/2016    History reviewed. No pertinent surgical history.     Home Medications    Prior to Admission medications   Medication Sig Start Date End Date Taking? Authorizing Provider  acetaminophen (TYLENOL) 500 MG tablet Take 1 tablet (500 mg total) by mouth every 6 (six) hours as needed. 10/17/19   Nche, Bonna Gains, NP  fluticasone (FLONASE) 50 MCG/ACT nasal spray Place 1 spray into both nostrils in the morning and at bedtime. 10/04/20   Valinda Hoar, NP  penicillin v potassium (VEETID) 500 MG tablet Take 500 mg by mouth every 6 (six) hours. Patient not taking: Reported on 05/12/2020 03/11/20   [provider]  sildenafil (VIAGRA) 50 MG tablet Take 1 tablet (50 mg total) by mouth daily as needed for erectile  dysfunction. Patient not taking: Reported on 05/12/2020 08/31/17   Nche, Bonna Gains, NP    Family History Family History  Problem Relation Age of Onset  . Drug abuse Father   . Alcohol abuse Father   . Heart disease Father   . Early death Father   . Heart disease Sister 36       CAD with MI  . Early death Sister   . Cancer Maternal Aunt        throat  . Cancer Maternal Uncle   . Heart disease Brother        heart transplant list, CHF    Social History Social History   Tobacco Use  . Smoking status: Former Games developer  . Smokeless tobacco: Never Used  Substance Use Topics  . Alcohol use: No    Comment: 20 years sober, does not attend AA meetings at this time.  . Drug use: No    Types: Cocaine    Comment: Has not used any cocaine in the last 20 years.     Allergies   Patient has no known allergies.   Review of Systems Review of Systems  Constitutional: Positive for chills. Negative for activity change, appetite change, diaphoresis, fatigue, fever and unexpected weight change.  HENT: Negative.   Eyes: Negative.   Respiratory: Negative.   Cardiovascular: Negative.   Gastrointestinal: Negative.   Musculoskeletal: Negative.   Skin: Negative.   Neurological: Positive for headaches. Negative for dizziness, tremors, seizures,  syncope, facial asymmetry, speech difficulty, weakness, light-headedness and numbness.     Physical Exam Triage Vital Signs ED Triage Vitals  Enc Vitals Group     BP 10/07/20 1010 (!) 141/82     Pulse Rate 10/07/20 1007 87     Resp 10/07/20 1007 18     Temp 10/07/20 1007 99.3 F (37.4 C)     Temp Source 10/07/20 1007 Oral     SpO2 10/07/20 1007 97 %     Weight --      Height --      Head Circumference --      Peak Flow --      Pain Score 10/07/20 1006 5     Pain Loc --      Pain Edu? --      Excl. in GC? --    No data found.  Updated Vital Signs BP (!) 141/82   Pulse 87   Temp 99.3 F (37.4 C)   Resp 18   SpO2 97%   Visual  Acuity Right Eye Distance:   Left Eye Distance:   Bilateral Distance:    Right Eye Near:   Left Eye Near:    Bilateral Near:     Physical Exam Constitutional:      Appearance: He is well-developed and normal weight.  HENT:     Head: Normocephalic.     Right Ear: Hearing, tympanic membrane, ear canal and external ear normal.     Left Ear: Hearing, tympanic membrane, ear canal and external ear normal.     Nose: Nose normal.     Mouth/Throat:     Pharynx: Uvula midline. Posterior oropharyngeal erythema present.  Eyes:     Extraocular Movements: Extraocular movements intact.     Pupils: Pupils are equal, round, and reactive to light.  Cardiovascular:     Rate and Rhythm: Normal rate and regular rhythm.     Heart sounds: Normal heart sounds.  Pulmonary:     Effort: Pulmonary effort is normal.     Breath sounds: Normal breath sounds.  Musculoskeletal:     Cervical back: Normal range of motion and neck supple.  Skin:    General: Skin is warm and dry.  Neurological:     Mental Status: He is alert and oriented to person, place, and time.  Psychiatric:        Speech: Speech normal.        Behavior: Behavior normal.      UC Treatments / Results  Labs (all labs ordered are listed, but only abnormal results are displayed) Labs Reviewed - No data to display  EKG   Radiology No results found.  Procedures Procedures (including critical care time)  Medications Ordered in UC Medications - No data to display  Initial Impression / Assessment and Plan / UC Course  I have reviewed the triage vital signs and the nursing notes.  Pertinent labs & imaging results that were available during my care of the patient were reviewed by me and considered in my medical decision making (see chart for details).  Viral URI  Cough has subsided since visit on 3/7. No new symptoms have occurred. Reviewed lab results from 3/7 with patient. Reassured patient that etiology is most likely viral  and management is to treat symptoms. Discussed over the counter remedies to aid with headache. Verbalized understanding.  Final Clinical Impressions(s) / UC Diagnoses   Final diagnoses:  Viral URI     Discharge Instructions  Can use 650 mg of tylenol every six hours to help with headache and possible fevers  Can use 400-600 mg of ibuprofen every 6 hours to help with headache   Continue to stay hydrated, aim for 8 glasses of water a day   aim for 8 hours of sleep to assure adequate rest    ED Prescriptions    None     PDMP not reviewed this encounter.   Valinda Hoar, NP 10/07/20 1040

## 2020-10-07 NOTE — Discharge Instructions (Addendum)
Can use 650 mg of tylenol every six hours to help with headache and possible fevers  Can use 400-600 mg of ibuprofen every 6 hours to help with headache   Continue to stay hydrated, aim for 8 glasses of water a day   aim for 8 hours of sleep to assure adequate rest

## 2020-10-12 ENCOUNTER — Emergency Department (HOSPITAL_COMMUNITY)
Admission: EM | Admit: 2020-10-12 | Discharge: 2020-10-12 | Disposition: A | Discharge status: Home / Self Care | Payer: 59 | Attending: Emergency Medicine | Admitting: Emergency Medicine

## 2020-10-12 ENCOUNTER — Emergency Department (HOSPITAL_COMMUNITY): Payer: 59

## 2020-10-12 ENCOUNTER — Other Ambulatory Visit: Payer: Self-pay

## 2020-10-12 ENCOUNTER — Encounter (HOSPITAL_COMMUNITY): Payer: Self-pay | Admitting: Emergency Medicine

## 2020-10-12 DIAGNOSIS — E119 Type 2 diabetes mellitus without complications: Secondary | ICD-10-CM | POA: Diagnosis not present

## 2020-10-12 DIAGNOSIS — R6883 Chills (without fever): Secondary | ICD-10-CM | POA: Diagnosis not present

## 2020-10-12 DIAGNOSIS — R519 Headache, unspecified: Secondary | ICD-10-CM | POA: Insufficient documentation

## 2020-10-12 DIAGNOSIS — Z87891 Personal history of nicotine dependence: Secondary | ICD-10-CM | POA: Diagnosis not present

## 2020-10-12 NOTE — Discharge Instructions (Addendum)
Your chest x-ray today does not show acute infection, this is reassuring.  Your neuro exam normal.  Recommend Motrin and Tylenol as needed as directed for headaches, be sure to drink plenty of hydrating fluids.  Follow-up with your primary care provider if headaches continue.

## 2020-10-12 NOTE — ED Provider Notes (Signed)
[facility] EMERGENCY DEPARTMENT Provider Note   CSN: [id number] Arrival date & time: 10/12/20  0954     History Chief Complaint  Patient presents with   Headache    Stephen Wong is a 60 y.o. male.  60 year old male with past medical history of diabetes, prior positive TB test in 1990s and completed treatment presents with complaint of throbbing posterior headache as well as chills and sweats.  Patient states his symptoms all started 2 weeks ago when he woke up in the morning feeling like he had a cold with congestion, chills, sweats.  Patient went to urgent care and had a negative Covid and flu test.  Return to urgent care for ongoing headaches, was advised to drink plenty of fluids, take Motrin and Tylenol as needed for his headaches.  Patient then discussed his symptoms with his friend who is a dentist who looked at his eyes and diagnosed him with a sinus infection which he treated with penicillin.  Patient is taking penicillin every 6 hours, feels like this is helping with his headaches.  Denies weakness, numbness, changes in vision, speech, gait.  No other complaints or concerns.        Past Medical History:  Diagnosis Date   DM (diabetes mellitus) (HCC)    Substance abuse (HCC)    Lead to incarceration, now 20 years sober from cocaine and alcohol.   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    Patient Active Problem List   Diagnosis Date Noted   Degenerative joint disease of left acromioclavicular joint 09/17/2019   Male erectile disorder 08/31/2017   FHx: cancer of prostate 04/13/2016   Hyperlipidemia, mixed 04/13/2016   DM (diabetes mellitus) (HCC) 04/13/2016    History reviewed. No pertinent surgical history.     Family History  Problem Relation Age of Onset   Drug abuse Father    Alcohol abuse Father    Heart disease Father    Early death Father    Heart disease Sister 60        CAD with MI   Early death Sister    Cancer Maternal Aunt        throat   Cancer Maternal Uncle    Heart disease Brother        heart transplant list, CHF    Social History   Tobacco Use   Smoking status: Former Smoker   Smokeless tobacco: Never Used  Substance Use Topics   Alcohol use: No    Comment: 20 years sober, does not attend AA meetings at this time.   Drug use: No    Types: Cocaine    Comment: Has not used any cocaine in the last 20 years.    Home Medications Prior to Admission medications   Medication Sig Start Date End Date Taking? Authorizing Provider  acetaminophen (TYLENOL) 500 MG tablet Take 1 tablet (500 mg total) by mouth every 6 (six) hours as needed. 10/17/19   Nche, Bonna Gains, NP  fluticasone (FLONASE) 50 MCG/ACT nasal spray Place 1 spray into both nostrils in the morning and at bedtime. 10/04/20   Valinda Hoar, NP  penicillin v potassium (VEETID) 500 MG tablet Take 500 mg by mouth every 6 (six) hours. Patient not taking: Reported on 05/12/2020 03/11/20   [provider]  sildenafil (VIAGRA) 50 MG tablet Take 1 tablet (50 mg total) by mouth daily as needed for erectile dysfunction. Patient not taking: Reported on  05/12/2020 08/31/17   Nche, Bonna Gains, NP    Allergies    Patient has no known allergies.  Review of Systems   Review of Systems  Constitutional: Positive for chills and diaphoresis. Negative for fever.  HENT: Negative for congestion, ear pain, sinus pressure, sinus pain and sore throat.   Eyes: Negative for visual disturbance.  Respiratory: Negative for cough, shortness of breath and wheezing.   Cardiovascular: Negative for chest pain.  Gastrointestinal: Negative for nausea and vomiting.  Musculoskeletal: Negative for arthralgias, gait problem and myalgias.  Skin: Negative for rash and wound.  Allergic/Immunologic: Negative for immunocompromised state.  Neurological: Positive for headaches. Negative for dizziness,  speech difficulty and weakness.  Hematological: Negative for adenopathy.  Psychiatric/Behavioral: Negative for confusion.  All other systems reviewed and are negative.   Physical Exam Updated Vital Signs BP 129/87 (BP Location: Left Arm)    Pulse (!) 58    Temp 97.6 F (36.4 C)    Resp 18    Ht 5\' 9"  (1.753 m)    Wt 88.5 kg    SpO2 100%    BMI 28.81 kg/m   Physical Exam Vitals and nursing note reviewed.  Constitutional:      General: He is not in acute distress.    Appearance: He is well-developed. He is not diaphoretic.  HENT:     Head: Normocephalic and atraumatic.     Right Ear: Tympanic membrane and ear canal normal.     Left Ear: Tympanic membrane and ear canal normal.     Nose: Nose normal. No congestion or rhinorrhea.     Mouth/Throat:     Mouth: Mucous membranes are moist.  Eyes:     Extraocular Movements: Extraocular movements intact.     Pupils: Pupils are equal, round, and reactive to light.  Cardiovascular:     Rate and Rhythm: Normal rate and regular rhythm.     Heart sounds: Normal heart sounds. No murmur heard.   Pulmonary:     Effort: Pulmonary effort is normal.     Breath sounds: Normal breath sounds.  Musculoskeletal:        General: No swelling or tenderness. Normal range of motion.     Cervical back: Neck supple.  Lymphadenopathy:     Cervical: No cervical adenopathy.  Skin:    General: Skin is warm and dry.     Findings: No erythema or rash.  Neurological:     Mental Status: He is alert and oriented to person, place, and time.     GCS: GCS eye subscore is 4. GCS verbal subscore is 5. GCS motor subscore is 6.     Cranial Nerves: No cranial nerve deficit.     Sensory: No sensory deficit.     Motor: No weakness.     Gait: Gait normal.  Psychiatric:        Behavior: Behavior normal.     ED Results / Procedures / Treatments   Labs (all labs ordered are listed, but only abnormal results are displayed) Labs Reviewed - No data to  display  EKG None  Radiology DG Chest 2 View  Result Date: 10/12/2020 CLINICAL DATA:  Sweats and chills. EXAM: CHEST - 2 VIEW COMPARISON:  January 22, 2017 FINDINGS: Mild blunting of the left costophrenic angle is likely due to scarring in this area. Lungs elsewhere are clear. Heart size and pulmonary vascularity are normal. No adenopathy. No bone lesions. IMPRESSION: Probable mild scarring lateral left base. Lungs otherwise clear. Heart  size normal. No adenopathy. Electronically Signed   By: Bretta Bang III M.D.   On: 10/12/2020 11:38    Procedures Procedures   Medications Ordered in ED Medications - No data to display  ED Course  I have reviewed the triage vital signs and the nursing notes.  Pertinent labs & imaging results that were available during my care of the patient were reviewed by me and considered in my medical decision making (see chart for details).  Clinical Course as of 10/12/20 1213  Tue Oct 12, 2020  [id number] 60 year old male with complaint of posterior headaches with chills and sweats following what sounds like a viral respiratory illness for the past 2 weeks. On exam, patient is well-appearing, neuro exam is unremarkable.  He has no cervical adenopathy, no tenderness over the mastoid area, no musculoskeletal tenderness of the neck. Due to complaint of chills and sweats post viral URI, chest x-ray was obtained to evaluate for pneumonia, does not show acute infection.  Recommend patient hydrate, take Motrin and Tylenol as directed and follow-up with his doctor if his headaches persist. [LM]    Clinical Course User Index [LM] Alden Hipp   MDM Rules/Calculators/A&P                          Final Clinical Impression(s) / ED Diagnoses Final diagnoses:  Nonintractable headache, unspecified chronicity pattern, unspecified headache type  Chills    Rx / DC Orders ED Discharge Orders    None       Alden Hipp 10/12/20 1213    Lorre Nick, MD 10/13/20 [phone]

## 2020-10-12 NOTE — ED Triage Notes (Signed)
Pt arrives to ED with c/o on-going posterior head pressure for the past week. Pt states the pressure comes and goes. No vision issues or numbness noted. C/o of cold sweats and chills. Pt dx with URI and his Dentist friend prescribed him Penicillin.

## 2020-12-15 ENCOUNTER — Encounter: Payer: Self-pay | Admitting: Nurse Practitioner

## 2020-12-15 ENCOUNTER — Ambulatory Visit (INDEPENDENT_AMBULATORY_CARE_PROVIDER_SITE_OTHER): Payer: 59 | Admitting: Nurse Practitioner

## 2020-12-15 ENCOUNTER — Other Ambulatory Visit: Payer: Self-pay

## 2020-12-15 VITALS — BP 112/66 | HR 69 | Temp 97.6°F | Ht 69.0 in | Wt 195.6 lb

## 2020-12-15 DIAGNOSIS — Z125 Encounter for screening for malignant neoplasm of prostate: Secondary | ICD-10-CM | POA: Diagnosis not present

## 2020-12-15 DIAGNOSIS — Z8042 Family history of malignant neoplasm of prostate: Secondary | ICD-10-CM | POA: Diagnosis not present

## 2020-12-15 DIAGNOSIS — Z113 Encounter for screening for infections with a predominantly sexual mode of transmission: Secondary | ICD-10-CM | POA: Diagnosis not present

## 2020-12-15 DIAGNOSIS — Z0001 Encounter for general adult medical examination with abnormal findings: Secondary | ICD-10-CM

## 2020-12-15 DIAGNOSIS — E1165 Type 2 diabetes mellitus with hyperglycemia: Secondary | ICD-10-CM

## 2020-12-15 DIAGNOSIS — E782 Mixed hyperlipidemia: Secondary | ICD-10-CM | POA: Diagnosis not present

## 2020-12-15 NOTE — Assessment & Plan Note (Signed)
LDL not at goal, but he chooses not to use any statin at this time. Repeat lipid panel

## 2020-12-15 NOTE — Assessment & Plan Note (Addendum)
Controlled with diet BP at goal Normal foot exam Advised to schedule appt with ophthalmology ([facility]). LDL not at goal, but he chooses not to use any statin at this time.  Repeat HgbA1c, CMP and lipid panel.

## 2020-12-15 NOTE — Patient Instructions (Addendum)
Schedule annual eye exam Schedule fasting lab appt. You need to be fasting 6-8hrs prior to blood draw. Ok to drink water.  Preventive Care 50-59 Years Old, Male Preventive care refers to lifestyle choices and visits with your health care provider that can promote health and wellness. This includes:  A yearly physical exam. This is also called an annual wellness visit.  Regular dental and eye exams.  Immunizations.  Screening for certain conditions.  Healthy lifestyle choices, such as: ? Eating a healthy diet. ? Getting regular exercise. ? Not using drugs or products that contain nicotine and tobacco. ? Limiting alcohol use. What can I expect for my preventive care visit? Physical exam Your health care provider will check your:  Height and weight. These may be used to calculate your BMI (body mass index). BMI is a measurement that tells if you are at a healthy weight.  Heart rate and blood pressure.  Body temperature.  Skin for abnormal spots. Counseling Your health care provider may ask you questions about your:  Past medical problems.  Family's medical history.  Alcohol, tobacco, and drug use.  Emotional well-being.  Home life and relationship well-being.  Sexual activity.  Diet, exercise, and sleep habits.  Work and work Astronomer.  Access to firearms. What immunizations do I need? Vaccines are usually given at various ages, according to a schedule. Your health care provider will recommend vaccines for you based on your age, medical history, and lifestyle or other factors, such as travel or where you work.   What tests do I need? Blood tests  Lipid and cholesterol levels. These may be checked every 5 years, or more often if you are over 43 years old.  Hepatitis C test.  Hepatitis B test. Screening  Lung cancer screening. You may have this screening every year starting at age 2 if you have a 30-pack-year history of smoking and currently smoke or have  quit within the past 15 years.  Prostate cancer screening. Recommendations will vary depending on your family history and other risks.  Genital exam to check for testicular cancer or hernias.  Colorectal cancer screening. ? All adults should have this screening starting at age 81 and continuing until age 34. ? Your health care provider may recommend screening at age 49 if you are at increased risk. ? You will have tests every 1-10 years, depending on your results and the type of screening test.  Diabetes screening. ? This is done by checking your blood sugar (glucose) after you have not eaten for a while (fasting). ? You may have this done every 1-3 years.  STD (sexually transmitted disease) testing, if you are at risk. Follow these instructions at home: Eating and drinking  Eat a diet that includes fresh fruits and vegetables, whole grains, lean protein, and low-fat dairy products.  Take vitamin and mineral supplements as recommended by your health care provider.  Do not drink alcohol if your health care provider tells you not to drink.  If you drink alcohol: ? Limit how much you have to 0-2 drinks a day. ? Be aware of how much alcohol is in your drink. In the [place] one drink equals one 12 oz bottle of beer (355 mL), one 5 oz glass of wine (148 mL), or one 1 oz glass of hard liquor (44 mL).   Lifestyle  Take daily care of your teeth and gums. Brush your teeth every morning and night with fluoride toothpaste. Floss one time each day.  Stay  active. Exercise for at least 30 minutes 5 or more days each week.  Do not use any products that contain nicotine or tobacco, such as cigarettes, e-cigarettes, and chewing tobacco. If you need help quitting, ask your health care provider.  Do not use drugs.  If you are sexually active, practice safe sex. Use a condom or other form of protection to prevent STIs (sexually transmitted infections).  If told by your health care provider, take  low-dose aspirin daily starting at age 66.  Find healthy ways to cope with stress, such as: ? Meditation, yoga, or listening to music. ? Journaling. ? Talking to a trusted person. ? Spending time with friends and family. Safety  Always wear your seat belt while driving or riding in a vehicle.  Do not drive: ? If you have been drinking alcohol. Do not ride with someone who has been drinking. ? When you are tired or distracted. ? While texting.  Wear a helmet and other protective equipment during sports activities.  If you have firearms in your house, make sure you follow all gun safety procedures. What's next?  Go to your health care provider once a year for an annual wellness visit.  Ask your health care provider how often you should have your eyes and teeth checked.  Stay up to date on all vaccines. This information is not intended to replace advice given to you by your health care provider. Make sure you discuss any questions you have with your health care provider. Document Revised: 04/15/2019 Document Reviewed: 07/11/2018 Elsevier Patient Education  2021 [employer].

## 2020-12-15 NOTE — Progress Notes (Signed)
Subjective:    Patient ID: Stephen Wong, male    DOB: July 29, 1961, 60 y.o.   MRN: [id number]  Patient presents today for CPE and eval of chronic conditions  HPI DM (diabetes mellitus) (HCC) Controlled with diet BP at goal Normal foot exam Advised to schedule appt with ophthalmology ([facility]). LDL not at goal, but he chooses not to use any statin at this time.  Repeat HgbA1c, CMP and lipid panel.  Hyperlipidemia, mixed LDL not at goal, but he chooses not to use any statin at this time. Repeat lipid panel  Sexual History (orientation,birth control, marital status, STD):sexually active, agreed to STD screen  Depression/Suicide: Depression screen [facility] 2/9 12/15/2020 06/03/2020 12/15/2019 10/17/2019 08/31/2017  Decreased Interest 2 0 0 0 0  Down, Depressed, Hopeless 2 0 0 0 0  PHQ - 2 Score 4 0 0 0 0  Altered sleeping 0 - - - -  Tired, decreased energy 2 - - - -  Change in appetite 0 - - - -  Feeling bad or failure about yourself  1 - - - -  Trouble concentrating 0 - - - -  Moving slowly or fidgety/restless 0 - - - -  Suicidal thoughts 0 - - - -  PHQ-9 Score 7 - - - -  Difficult doing work/chores Not difficult at all - - - -   Vision:will schedule  Dental:up to date  Immunizations: (TDAP, Hep C screen, Pneumovax, Influenza, zoster)  Health Maintenance  Topic Date Due  . COVID-19 Vaccine (3 - Booster for Pfizer series) 03/25/2020  . Hemoglobin A1C  09/16/2020  . Urine Protein Check  12/14/2020  . Eye exam for diabetics  12/24/2020  . Flu Shot  02/28/2021  . Complete foot exam   12/15/2021  . Colon Cancer Screening  11/11/2023  . Tetanus Vaccine  04/13/2026  . Pneumococcal vaccine  Completed  . Hepatitis C Screening: USPSTF Recommendation to screen - Ages 23-79 yo.  Completed  . HIV Screening  Completed  . HPV Vaccine  Aged Out   Diet:regular Exercise: walking Weight:  Wt Readings from Last 3 Encounters:  12/15/20 195 lb 9.6 oz (88.7 kg)  10/12/20 195 lb 1.7 oz  (88.5 kg)  06/03/20 195 lb (88.5 kg)   Fall Risk: Fall Risk  12/15/2020 06/03/2020 12/15/2019 10/17/2019 08/31/2017  Falls in the past year? 0 0 0 0 No  Number falls in past yr: 0 - 0 0 -  Injury with Fall? 0 - 0 0 -  Risk for fall due to : No Fall Risks - - - -  Follow up Falls evaluation completed - - - -   Medications and allergies reviewed with patient and updated if appropriate.  Patient Active Problem List   Diagnosis Date Noted  . Degenerative joint disease of left acromioclavicular joint 09/17/2019  . Male erectile disorder 08/31/2017  . FHx: cancer of prostate 04/13/2016  . Hyperlipidemia, mixed 04/13/2016  . DM (diabetes mellitus) (HCC) 04/13/2016    Current Outpatient Medications on File Prior to Visit  Medication Sig Dispense Refill  . acetaminophen (TYLENOL) 500 MG tablet Take 1 tablet (500 mg total) by mouth every 6 (six) hours as needed. 30 tablet 0  . fluticasone (FLONASE) 50 MCG/ACT nasal spray Place 1 spray into both nostrils in the morning and at bedtime. 11.1 mL 2   No current facility-administered medications on file prior to visit.    Past Medical History:  Diagnosis Date  . DM (diabetes mellitus) (HCC)   .  Substance abuse (HCC)    Lead to incarceration, now 20 years sober from cocaine and alcohol.  . 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    History reviewed. No pertinent surgical history.  Social History   Socioeconomic History  . Marital status: Single    Spouse name: Not on file  . Number of children: Not on file  . Years of education: Not on file  . Highest education level: Not on file  Occupational History  . Not on file  Tobacco Use  . Smoking status: Former Games developermoker  . Smokeless tobacco: Never Used  Vaping Use  . Vaping Use: Never used  Substance and Sexual Activity  . Alcohol use: No    Comment: 20 years sober, does not attend AA meetings at this time.  . Drug use: No    Types: Cocaine     Comment: Has not used any cocaine in the last 20 years.  . Sexual activity: Never  Other Topics Concern  . Not on file  Social History Narrative  . Not on file   Social Determinants of Health   Financial Resource Strain: Not on file  Food Insecurity: No Food Insecurity  . Worried About Programme researcher, broadcasting/film/videounning Out of Food in the Last Year: Never true  . Ran Out of Food in the Last Year: Never true  Transportation Needs: Not on file  Physical Activity: Not on file  Stress: Not on file  Social Connections: Not on file    Family History  Problem Relation Age of Onset  . Drug abuse Father   . Alcohol abuse Father   . Heart disease Father   . Early death Father   . Heart disease Sister [id number]       CAD with MI  . Early death Sister   . Cancer Maternal Aunt        throat  . Cancer Maternal Uncle   . Heart disease Brother        heart transplant list, CHF        Review of Systems  Constitutional: Negative for fever, malaise/fatigue and weight loss.  HENT: Negative for congestion and sore throat.   Eyes:       Negative for visual changes  Respiratory: Negative for cough and shortness of breath.   Cardiovascular: Negative for chest pain, palpitations and leg swelling.  Gastrointestinal: Negative for blood in stool, constipation, diarrhea and heartburn.  Genitourinary: Negative for dysuria, frequency and urgency.  Musculoskeletal: Negative for falls, joint pain and myalgias.  Skin: Negative for rash.  Neurological: Negative for dizziness, sensory change and headaches.  Endo/Heme/Allergies: Does not bruise/bleed easily.  Psychiatric/Behavioral: Negative for depression, substance abuse and suicidal ideas. The patient is not nervous/anxious.     Objective:   Vitals:   12/15/20 1127  BP: 112/66  Pulse: 69  Temp: 97.6 F (36.4 C)  SpO2: 97%    Body mass index is 28.89 kg/m.   Physical Examination:  Physical Exam Vitals reviewed.  Constitutional:      General: He is not in acute  distress.    Appearance: He is well-developed.  HENT:     Right Ear: Tympanic membrane, ear canal and external ear normal.     Left Ear: Tympanic membrane, ear canal and external ear normal.  Eyes:     Extraocular Movements: Extraocular movements intact.     Conjunctiva/sclera: Conjunctivae normal.  Cardiovascular:     Rate and Rhythm: Normal rate and  regular rhythm.     Heart sounds: Normal heart sounds.  Pulmonary:     Effort: Pulmonary effort is normal. No respiratory distress.     Breath sounds: Normal breath sounds.  Chest:     Chest wall: No tenderness.  Abdominal:     General: Bowel sounds are normal.     Palpations: Abdomen is soft.  Musculoskeletal:        General: Normal range of motion.     Cervical back: Normal range of motion and neck supple.     Right lower leg: No edema.     Left lower leg: No edema.  Skin:    General: Skin is warm and dry.     Comments: Normal diabetic foot exam  Neurological:     Mental Status: He is alert and oriented to person, place, and time.     Deep Tendon Reflexes: Reflexes are normal and symmetric.  Psychiatric:        Mood and Affect: Mood normal.        Behavior: Behavior normal.        Thought Content: Thought content normal.    ASSESSMENT and PLAN: This visit occurred during the SARS-CoV-2 public health emergency.  Safety protocols were in place, including screening questions prior to the visit, additional usage of staff PPE, and extensive cleaning of exam room while observing appropriate contact time as indicated for disinfecting solutions.   Traquan was seen today for annual exam.  Diagnoses and all orders for this visit:  Encounter for preventative adult health care exam with abnormal findings -     CBC; Future -     Comprehensive metabolic panel; Future  Type 2 diabetes mellitus with hyperglycemia, without long-term current use of insulin (HCC) -     Hemoglobin A1c; Future -     Microalbumin / creatinine urine ratio;  Future  Hyperlipidemia, mixed -     Lipid panel; Future  FHx: cancer of prostate -     PSA; Future  Prostate cancer screening -     PSA; Future  Screen for STD (sexually transmitted disease) -     HIV antibody (with reflex); Future -     RPR; Future -     Urine cytology ancillary only([place]); Future      Problem List Items Addressed This Visit      Endocrine   DM (diabetes mellitus) (HCC)    Controlled with diet BP at goal Normal foot exam Advised to schedule appt with ophthalmology ([facility]). LDL not at goal, but he chooses not to use any statin at this time.  Repeat HgbA1c, CMP and lipid panel.      Relevant Orders   Hemoglobin A1c   Microalbumin / creatinine urine ratio     Other   FHx: cancer of prostate   Relevant Orders   PSA   Hyperlipidemia, mixed    LDL not at goal, but he chooses not to use any statin at this time. Repeat lipid panel      Relevant Orders   Lipid panel    Other Visit Diagnoses    Encounter for preventative adult health care exam with abnormal findings    -  Primary   Relevant Orders   CBC   Comprehensive metabolic panel   Prostate cancer screening       Relevant Orders   PSA   Screen for STD (sexually transmitted disease)       Relevant Orders   HIV antibody (with  reflex)   RPR   Urine cytology ancillary only([facility])      Follow up: Return in about 6 months (around 06/17/2021) for DM and , hyperlipidemia (fasting).  Alysia Penna, NP

## 2020-12-16 ENCOUNTER — Other Ambulatory Visit (INDEPENDENT_AMBULATORY_CARE_PROVIDER_SITE_OTHER): Payer: 59

## 2020-12-16 ENCOUNTER — Other Ambulatory Visit (HOSPITAL_COMMUNITY)
Admission: RE | Admit: 2020-12-16 | Discharge: 2020-12-16 | Disposition: A | Discharge status: Home / Self Care | Payer: 59 | Source: Ambulatory Visit | Attending: Nurse Practitioner | Admitting: Nurse Practitioner

## 2020-12-16 DIAGNOSIS — Z113 Encounter for screening for infections with a predominantly sexual mode of transmission: Secondary | ICD-10-CM | POA: Diagnosis present

## 2020-12-16 DIAGNOSIS — E782 Mixed hyperlipidemia: Secondary | ICD-10-CM | POA: Diagnosis not present

## 2020-12-16 DIAGNOSIS — Z0001 Encounter for general adult medical examination with abnormal findings: Secondary | ICD-10-CM

## 2020-12-16 DIAGNOSIS — Z8042 Family history of malignant neoplasm of prostate: Secondary | ICD-10-CM | POA: Diagnosis not present

## 2020-12-16 DIAGNOSIS — Z125 Encounter for screening for malignant neoplasm of prostate: Secondary | ICD-10-CM

## 2020-12-16 DIAGNOSIS — E1165 Type 2 diabetes mellitus with hyperglycemia: Secondary | ICD-10-CM

## 2020-12-16 DIAGNOSIS — E781 Pure hyperglyceridemia: Secondary | ICD-10-CM | POA: Diagnosis not present

## 2020-12-16 LAB — COMPREHENSIVE METABOLIC PANEL
ALT: 22 U/L (ref 0–53)
AST: 16 U/L (ref 0–37)
Albumin: 4.3 g/dL (ref 3.5–5.2)
Alkaline Phosphatase: 69 U/L (ref 39–117)
BUN: 10 mg/dL (ref 6–23)
CO2: 26 mEq/L (ref 19–32)
Calcium: 9.5 mg/dL (ref 8.4–10.5)
Chloride: 104 mEq/L (ref 96–112)
Creatinine, Ser: 0.92 mg/dL (ref 0.40–1.50)
GFR: 91.16 mL/min (ref >60.00)
Glucose, Bld: 108 mg/dL — ABNORMAL HIGH (ref 70–99)
Potassium: 4 mEq/L (ref 3.5–5.1)
Sodium: 139 mEq/L (ref 135–145)
Total Bilirubin: 0.4 mg/dL (ref 0.2–1.2)
Total Protein: 7.1 g/dL (ref 6.0–8.3)

## 2020-12-16 LAB — LDL CHOLESTEROL, DIRECT: Direct LDL: 62 mg/dL

## 2020-12-16 LAB — LIPID PANEL
Cholesterol: 208 mg/dL — ABNORMAL HIGH (ref 0–200)
HDL: 52.2 mg/dL (ref >39.00)
NonHDL: 155.64
Total CHOL/HDL Ratio: 4
Triglycerides: 300 mg/dL — ABNORMAL HIGH (ref 0.0–149.0)
VLDL: 60 mg/dL — ABNORMAL HIGH (ref 0.0–40.0)

## 2020-12-16 LAB — HEMOGLOBIN A1C: Hgb A1c MFr Bld: 6.4 % (ref 4.6–6.5)

## 2020-12-16 LAB — MICROALBUMIN / CREATININE URINE RATIO
Creatinine,U: 105.6 mg/dL
Microalb Creat Ratio: 0.7 mg/g (ref 0.0–30.0)
Microalb, Ur: <0.7 mg/dL (ref 0.0–1.9)

## 2020-12-16 LAB — CBC
HCT: 43.8 % (ref 39.0–52.0)
Hemoglobin: 14.7 g/dL (ref 13.0–17.0)
MCHC: 33.6 g/dL (ref 30.0–36.0)
MCV: 88 fl (ref 78.0–100.0)
Platelets: 312 10*3/uL (ref 150.0–400.0)
RBC: 4.97 Mil/uL (ref 4.22–5.81)
RDW: 13.6 % (ref 11.5–15.5)
WBC: 7 10*3/uL (ref 4.0–10.5)

## 2020-12-16 LAB — PSA: PSA: 0.49 ng/mL (ref 0.10–4.00)

## 2020-12-17 LAB — URINE CYTOLOGY ANCILLARY ONLY
Chlamydia: NEGATIVE
Comment: NEGATIVE
Comment: NEGATIVE
Comment: NORMAL
Neisseria Gonorrhea: NEGATIVE
Trichomonas: NEGATIVE

## 2020-12-17 LAB — HIV ANTIBODY (ROUTINE TESTING W REFLEX): HIV 1&2 Ab, 4th Generation: NONREACTIVE

## 2020-12-17 LAB — RPR: RPR Ser Ql: NONREACTIVE

## 2020-12-30 MED ORDER — FENOFIBRATE 145 MG PO TABS: 145.0000 mg | ORAL_TABLET | Freq: Every day | ORAL | 1 refills | Status: AC

## 2020-12-30 NOTE — Addendum Note (Signed)
Addended by: Michaela Corner on: 12/30/2020 04:51 PM   Modules accepted: Orders

## 2021-04-20 ENCOUNTER — Telehealth: Payer: Self-pay | Admitting: Nurse Practitioner

## 2021-04-20 NOTE — Telephone Encounter (Signed)
Please advise message below  °

## 2021-04-21 NOTE — Telephone Encounter (Signed)
Called pharmacy to see if they still had Rx from June 2022 on file for patient per pharmacist  they do and will have ready for patient. Called patient to inform and to also let patient know the cost of the medication. No answer unable to LM.

## 2021-04-27 NOTE — Telephone Encounter (Signed)
Sent patient message via Mychart per his request informing that requested Rx was called into his pharmacy.

## 2021-04-27 NOTE — Telephone Encounter (Signed)
Pt said just to send him a message through [place] because he is not able to accept incoming calls at this time

## 2021-05-16 IMAGING — CR DG CHEST 2V
2 series · 2 of 2 positions shown · non-contrast
Comparison: January 22, 2017

CLINICAL DATA: Sweats and chills.

EXAM:
CHEST - 2 VIEW

[chest pa]
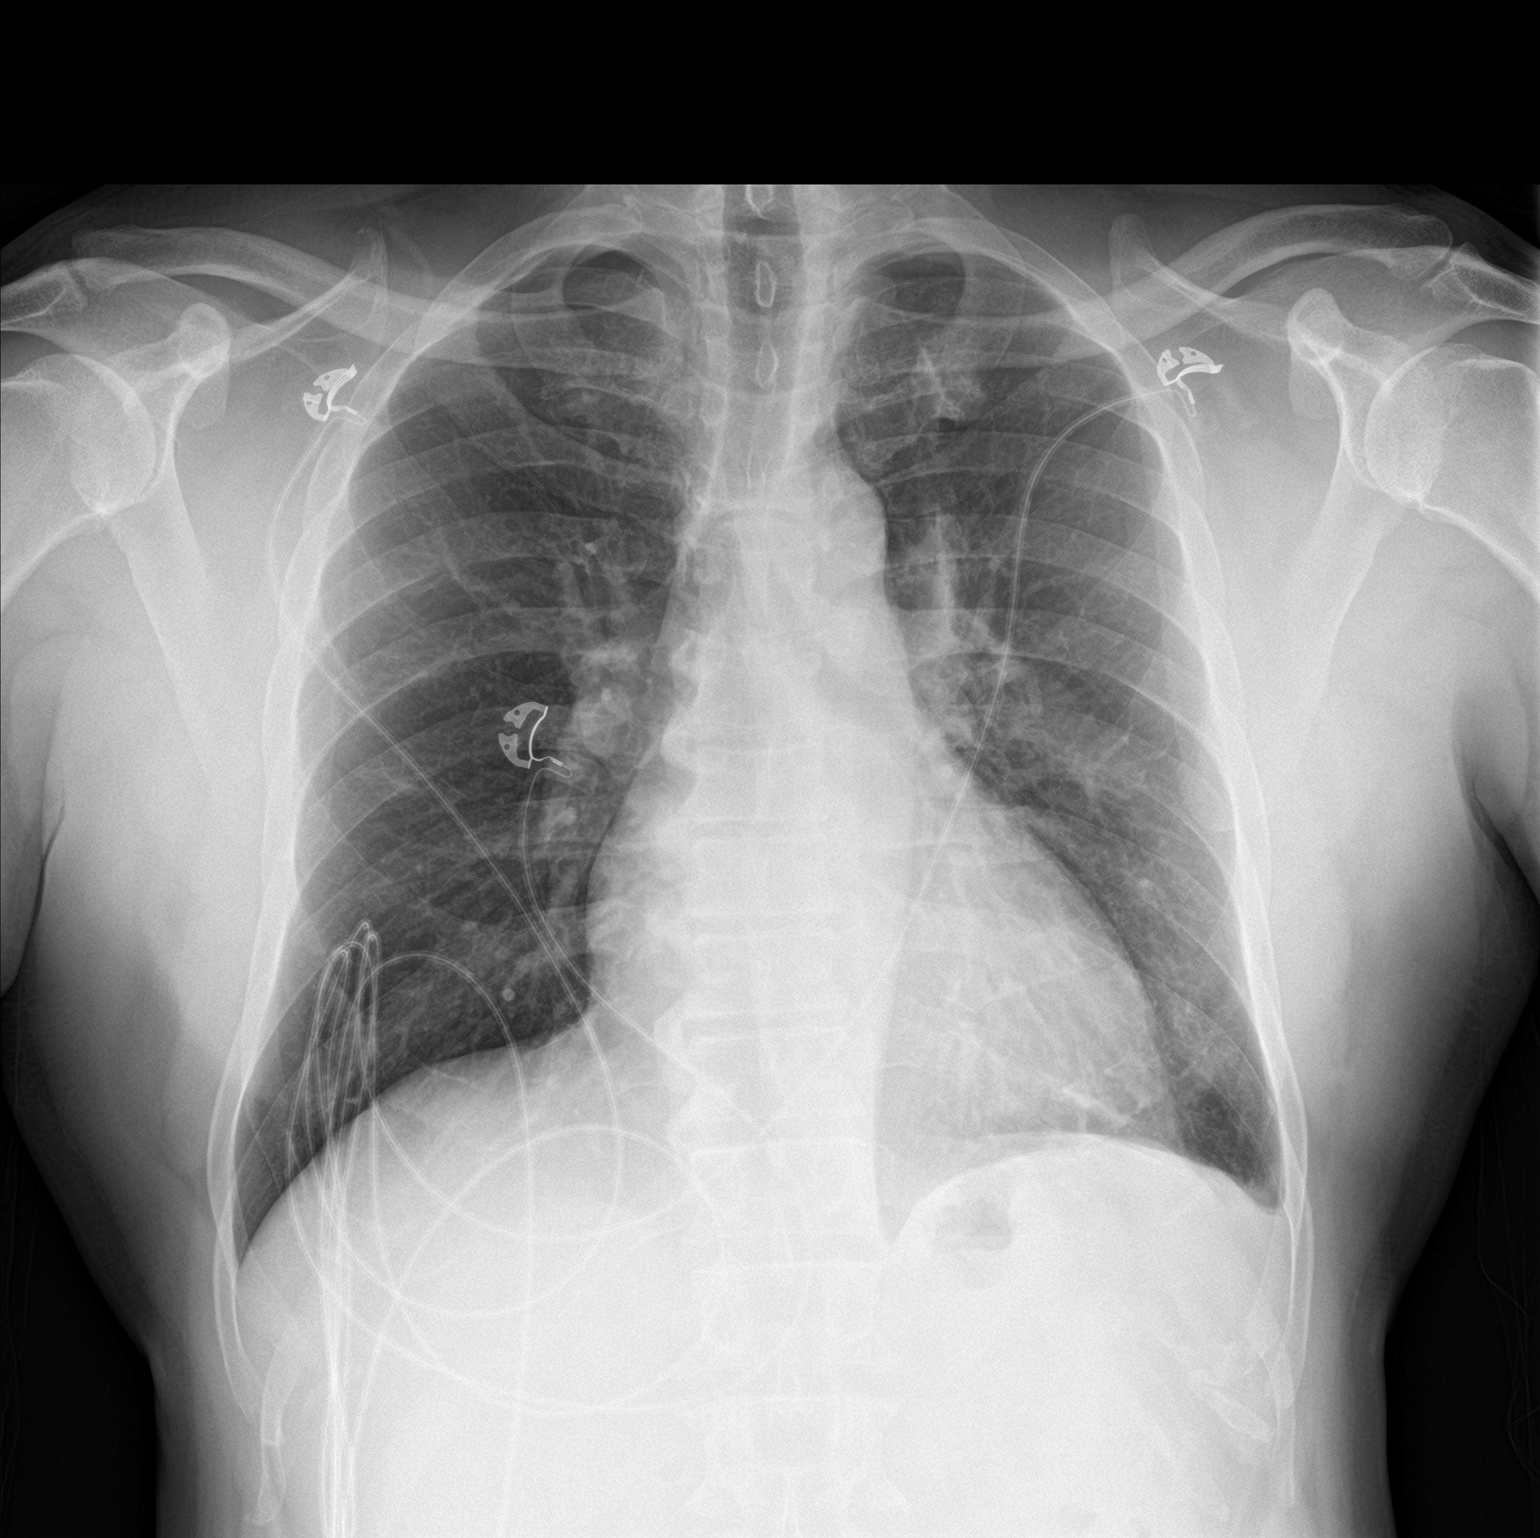

[chest lat]
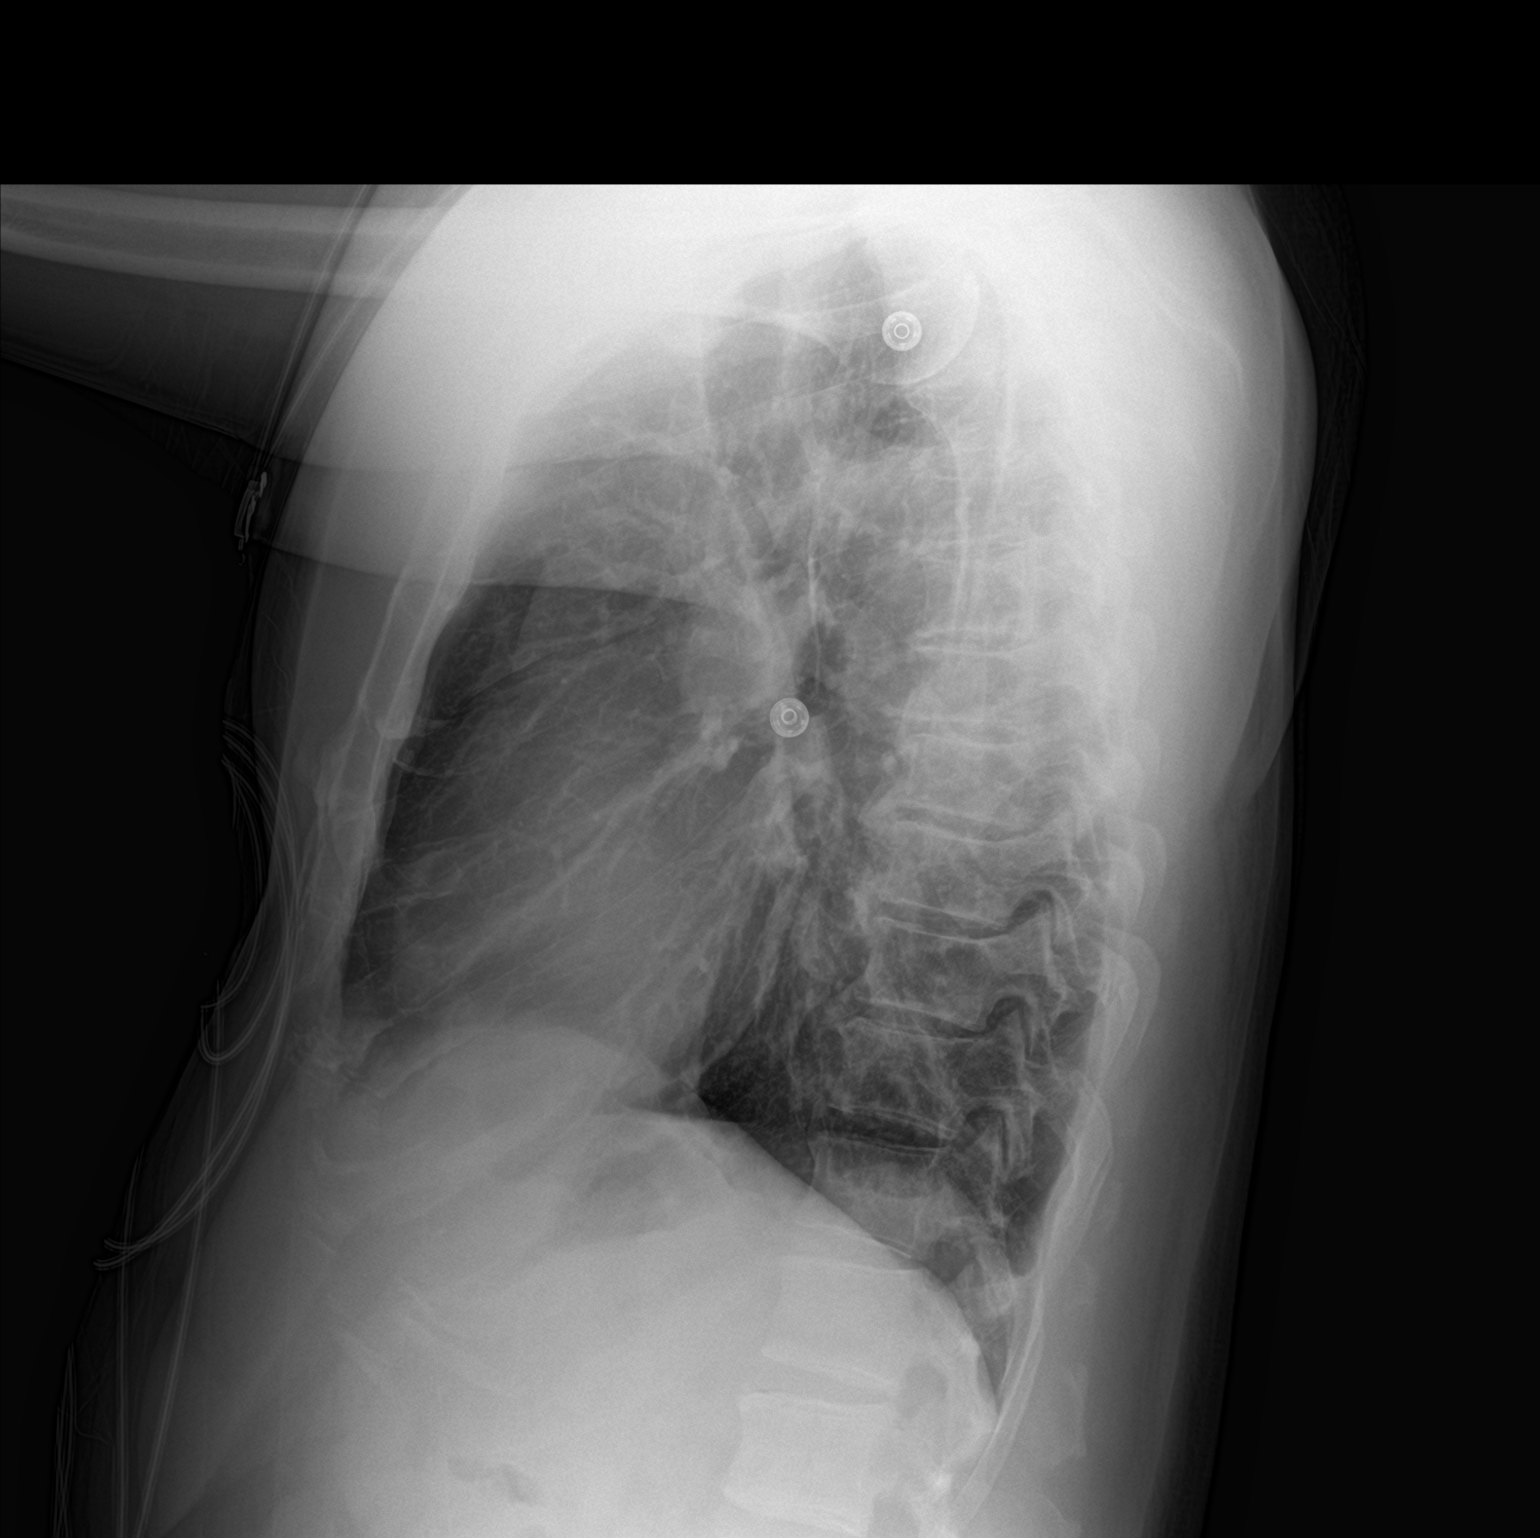

[2 of 2 positions shown; findings below may reference images not displayed]

FINDINGS: Mild blunting of the left costophrenic angle is likely due to
scarring in this area. Lungs elsewhere are clear. Heart size and
pulmonary vascularity are normal. No adenopathy. No bone lesions.
IMPRESSION: Probable mild scarring lateral left base. Lungs otherwise clear.
Heart size normal. No adenopathy.

## 2021-12-16 ENCOUNTER — Encounter: Payer: Self-pay | Admitting: Nurse Practitioner

## 2021-12-20 ENCOUNTER — Encounter: Payer: Self-pay | Admitting: Nurse Practitioner

## 2021-12-21 ENCOUNTER — Telehealth: Payer: Self-pay | Admitting: Nurse Practitioner

## 2021-12-21 NOTE — Telephone Encounter (Signed)
late arrival 05/06/2020 Late cancel 12/16/21 no show 12/20/21  Letter sent that additional missed visits will lead to dismissal.

## 2022-01-03 ENCOUNTER — Encounter: Payer: Self-pay | Admitting: Nurse Practitioner

## 2022-01-03 ENCOUNTER — Telehealth: Payer: Self-pay | Admitting: Nurse Practitioner

## 2022-01-03 NOTE — Telephone Encounter (Signed)
-----   Message from Anne Ng, NP sent at 01/03/2022  9:23 AM EDT ----- Missed his appt today despite previous warning Proceed with dismissal letter.  Thank you

## 2022-01-03 NOTE — Telephone Encounter (Signed)
Generated charge and dismissal letter being mailed out today

## 2022-01-12 NOTE — Telephone Encounter (Signed)
Mail returned. I have sent dismissal letter to pt via mychart.

## 2022-05-01 ENCOUNTER — Telehealth: Payer: Self-pay

## 2022-05-01 NOTE — Telephone Encounter (Addendum)
Pt called and spoke with Terrill & requested call back on being dismissed.  LVM for pt to call back & sent dismissal letter via [place]. Advised pt of letter and to call if further information needed. Pt missed 4 appointments with our office. We extended courtesy as pt went to the wrong office 1 of the 4 instances.

## 2022-05-01 NOTE — Telephone Encounter (Signed)
Pt called back and states he never received any letters or notifications for appointments. He said he would not have skipped visits. Pt noted that he never received letters. I advised patients letter was resent through [place]. He stated that he's never heard of a doctor not seeing a patient anymore. He said he's had issues with his mail for years. He said they don't put mail in his box even though he has his name and house # on the mailbox. He said the lady won't deliver to him sometimes. I apologized for mail delivery failure and dismissal from office & reiterated office policy for 3 missed visits (late cancel/no show/late arrival). Pt said he'll find another doctor and disconnected call.

## 2022-11-13 ENCOUNTER — Encounter: Payer: Self-pay | Admitting: *Deleted
